# Patient Record
Sex: Male | Born: 1960
Health system: Southern US, Community
[De-identification: ages and names within clinical notes are randomized; demographics above are authoritative.]

## PROBLEM LIST (undated history)

## (undated) DIAGNOSIS — E7800 Pure hypercholesterolemia, unspecified: Secondary | ICD-10-CM

## (undated) DIAGNOSIS — G4730 Sleep apnea, unspecified: Secondary | ICD-10-CM

## (undated) DIAGNOSIS — I10 Essential (primary) hypertension: Secondary | ICD-10-CM

## (undated) DIAGNOSIS — I440 Atrioventricular block, first degree: Secondary | ICD-10-CM

## (undated) DIAGNOSIS — G629 Polyneuropathy, unspecified: Secondary | ICD-10-CM

## (undated) DIAGNOSIS — R002 Palpitations: Secondary | ICD-10-CM

## (undated) HISTORY — DX: Essential (primary) hypertension: I10

## (undated) HISTORY — DX: Palpitations: R00.2

## (undated) HISTORY — PX: ROTATOR CUFF REPAIR: SHX139

## (undated) HISTORY — DX: Atrioventricular block, first degree: I44.0

## (undated) HISTORY — DX: Sleep apnea, unspecified: G47.30

---

## 1998-12-26 ENCOUNTER — Inpatient Hospital Stay (HOSPITAL_COMMUNITY): Admission: EM | Admit: 1998-12-26 | Discharge: 1998-12-27 | Payer: Self-pay | Admitting: Emergency Medicine

## 1998-12-27 ENCOUNTER — Encounter: Payer: Self-pay | Admitting: Cardiology

## 1999-10-17 ENCOUNTER — Emergency Department (HOSPITAL_COMMUNITY): Admission: EM | Admit: 1999-10-17 | Discharge: 1999-10-17 | Payer: Self-pay | Admitting: Emergency Medicine

## 1999-10-17 ENCOUNTER — Encounter: Payer: Self-pay | Admitting: Emergency Medicine

## 2003-09-25 DIAGNOSIS — G4730 Sleep apnea, unspecified: Secondary | ICD-10-CM

## 2003-09-25 HISTORY — DX: Sleep apnea, unspecified: G47.30

## 2008-07-15 ENCOUNTER — Encounter: Admission: RE | Admit: 2008-07-15 | Discharge: 2008-07-15 | Payer: Self-pay | Admitting: Orthopedic Surgery

## 2010-06-23 ENCOUNTER — Emergency Department (HOSPITAL_COMMUNITY): Admission: EM | Admit: 2010-06-23 | Discharge: 2010-06-23 | Payer: Self-pay | Admitting: Emergency Medicine

## 2015-03-14 ENCOUNTER — Observation Stay (HOSPITAL_COMMUNITY)
Admission: EM | Admit: 2015-03-14 | Discharge: 2015-03-15 | Disposition: A | Discharge status: Home / Self Care | Payer: 59 | Attending: Internal Medicine | Admitting: Internal Medicine

## 2015-03-14 ENCOUNTER — Encounter (HOSPITAL_COMMUNITY): Payer: Self-pay | Admitting: Emergency Medicine

## 2015-03-14 DIAGNOSIS — R61 Generalized hyperhidrosis: Secondary | ICD-10-CM | POA: Diagnosis not present

## 2015-03-14 DIAGNOSIS — R112 Nausea with vomiting, unspecified: Secondary | ICD-10-CM | POA: Insufficient documentation

## 2015-03-14 DIAGNOSIS — R0602 Shortness of breath: Secondary | ICD-10-CM | POA: Diagnosis not present

## 2015-03-14 DIAGNOSIS — G629 Polyneuropathy, unspecified: Secondary | ICD-10-CM | POA: Insufficient documentation

## 2015-03-14 DIAGNOSIS — E780 Pure hypercholesterolemia: Secondary | ICD-10-CM | POA: Insufficient documentation

## 2015-03-14 DIAGNOSIS — R42 Dizziness and giddiness: Secondary | ICD-10-CM | POA: Diagnosis not present

## 2015-03-14 DIAGNOSIS — E785 Hyperlipidemia, unspecified: Secondary | ICD-10-CM | POA: Diagnosis not present

## 2015-03-14 DIAGNOSIS — I10 Essential (primary) hypertension: Secondary | ICD-10-CM | POA: Diagnosis not present

## 2015-03-14 DIAGNOSIS — R079 Chest pain, unspecified: Principal | ICD-10-CM | POA: Diagnosis present

## 2015-03-14 DIAGNOSIS — Z885 Allergy status to narcotic agent status: Secondary | ICD-10-CM | POA: Diagnosis not present

## 2015-03-14 DIAGNOSIS — Z79899 Other long term (current) drug therapy: Secondary | ICD-10-CM | POA: Diagnosis not present

## 2015-03-14 HISTORY — DX: Pure hypercholesterolemia, unspecified: E78.00

## 2015-03-14 HISTORY — DX: Polyneuropathy, unspecified: G62.9

## 2015-03-14 NOTE — ED Provider Notes (Addendum)
CSN: [id number]     Arrival date & time 03/14/15  2341 History   First MD Initiated Contact with Patient 03/14/15 2347     This chart was scribed for Loren Racer, MD by Arlan Organ, ED Scribe. This patient was seen in room A05C/A05C and the patient's care was started 12:01 AM.   Chief Complaint  Patient presents with  . Chest Pain  . Nausea  . Emesis    x1   HPI  HPI Comments: Bryse Blanchette brought in by EMS is a 54 y.o. male presents to the Emergency Department complaining of sudden onset L-sided chest pain that radiates to the neck and back onset 11:30 PM this evening while driving in car. Pain rated 8/10 but has improved since time of onset. He also reports sudden onset HA, shortness of breath, nausea, diaphoresis, 1 episode of vomiting, and lightheadedness. 324 mg ASA and 1 nitro tablet given en route to department. Currently he is chest pain and HA free. No recent fever or chills. No previous history of same. No family history of heart issues. Pt takes Bystolic, Atorvastatin, and Neurontin daily. Took medications at 8 pm this evening. No missed or additional doses taken prior to incident. Pt with known allergy to Codeine.  Past Medical History  Diagnosis Date  . Peripheral neuropathy   . High cholesterol    Past Surgical History  Procedure Laterality Date  . Orthopedic surgery     History reviewed. No pertinent family history. History  Substance Use Topics  . Smoking status: Never Smoker   . Smokeless tobacco: Not on file  . Alcohol Use: No    Review of Systems  Constitutional: Positive for diaphoresis. Negative for fever and chills.  Eyes: Negative for photophobia and visual disturbance.  Respiratory: Positive for shortness of breath. Negative for cough and wheezing.   Cardiovascular: Positive for chest pain. Negative for palpitations and leg swelling.  Gastrointestinal: Positive for nausea and vomiting. Negative for abdominal pain.  Musculoskeletal: Negative for  myalgias, back pain, arthralgias, neck pain and neck stiffness.  Skin: Negative for wound.  Neurological: Positive for dizziness, light-headedness and headaches. Negative for syncope, weakness and numbness.  Psychiatric/Behavioral: Negative for confusion.  All other systems reviewed and are negative.     Allergies  Codeine and Hydrocodone  Home Medications   Prior to Admission medications   Medication Sig Start Date End Date Taking? Authorizing Provider  atorvastatin (LIPITOR) 40 MG tablet Take 40 mg by mouth daily.   Yes Historical Provider, MD  desvenlafaxine (PRISTIQ) 100 MG 24 hr tablet Take 100 mg by mouth daily.   Yes Historical Provider, MD  gabapentin (NEURONTIN) 400 MG capsule Take 400 mg by mouth daily.    Yes Historical Provider, MD  nebivolol (BYSTOLIC) 10 MG tablet Take 10 mg by mouth daily.   Yes Historical Provider, MD   Triage Vitals: BP 104/58 mmHg  Pulse 49  Temp(Src) 97.8 F (36.6 C) (Oral)  Resp 16  Ht  (1.854 m)  Wt 212 lb (96.163 kg)  BMI 27.98 kg/m2  SpO2 99%   Physical Exam  Constitutional: He is oriented to person, place, and time. He appears well-developed and well-nourished. No distress.  HENT:  Head: Normocephalic and atraumatic.  Mouth/Throat: Oropharynx is clear and moist.  Eyes: EOM are normal. Pupils are equal, round, and reactive to light.  Neck: Normal range of motion. Neck supple.  Cardiovascular: Normal rate and regular rhythm.   Pulmonary/Chest: Effort normal and breath sounds normal.  No respiratory distress. He has no wheezes. He has no rales.  Abdominal: Soft. Bowel sounds are normal. He exhibits no distension and no mass. There is no tenderness. There is no rebound and no guarding.  Musculoskeletal: Normal range of motion. He exhibits no edema or tenderness.  No calf swelling or tenderness. Distal pulses intact.  Neurological: He is alert and oriented to person, place, and time.  Patient is alert and oriented x3 with clear, goal  oriented speech. Patient has 5/5 motor in all extremities. Sensation is intact to light touch. Bilateral finger-to-nose is normal with no signs of dysmetria.   Skin: Skin is warm and dry. No rash noted. No erythema.  Psychiatric: He has a normal mood and affect. His behavior is normal.  Nursing note and vitals reviewed.   ED Course  Procedures (including critical care time)  DIAGNOSTIC STUDIES: Oxygen Saturation is 99% on RA, Normal by my interpretation.    COORDINATION OF CARE: 12:06 AM- Will order CBC, BMP, i-stat troponin, and CXR. Discussed treatment plan with pt at bedside and pt agreed to plan.     Labs Review Labs Reviewed  BASIC METABOLIC PANEL - Abnormal; Notable for the following:    Glucose, Bld 115 (*)    Calcium 8.8 (*)    All other components within normal limits  CBC  TROPONIN I  TROPONIN I  TROPONIN I  I-STAT TROPOININ, ED    Imaging Review Dg Chest 2 View  03/15/2015   CLINICAL DATA:  Acute onset of left-sided chest pain, radiating to the neck and back. Nausea and vomiting. Lightheadedness. Initial encounter.  EXAM: CHEST  2 VIEW  COMPARISON:  None.  FINDINGS: The lungs are well-aerated and clear. There is no evidence of focal opacification, pleural effusion or pneumothorax.  The heart is normal in size; the mediastinal contour is within normal limits. No acute osseous abnormalities are seen.  IMPRESSION: No acute cardiopulmonary process seen.   Electronically Signed   By: Roanna Raider M.D.   On: 03/15/2015 00:57   Ct Head Wo Contrast  03/15/2015   CLINICAL DATA:  Patient with left-sided chest pain. Lightheadedness. Vomiting.  EXAM: CT HEAD WITHOUT CONTRAST  TECHNIQUE: Contiguous axial images were obtained from the base of the skull through the vertex without intravenous contrast.  COMPARISON:  None.  FINDINGS: Ventricles and sulci appropriate for patient's age. No evidence for acute cortically based infarct, intracranial hemorrhage, mass lesion or mass-effect.  Orbits are unremarkable. Paranasal sinuses are well aerated. Mastoid air cells are well aerated. Calvarium is intact.  IMPRESSION: No acute intracranial process.   Electronically Signed   By: Annia Belt M.D.   On: 03/15/2015 00:56     EKG Interpretation   Date/Time:  Monday March 14 2015 23:48:24 EDT Ventricular Rate:  54 PR Interval:  219 QRS Duration: 105 QT Interval:  430 QTC Calculation: 407 R Axis:   84 Text Interpretation:  Sinus rhythm Prolonged PR interval RSR' in V1 or V2,  right VCD or RVH ST elev, probable normal early repol pattern Confirmed by  Ranae Palms  MD, Lenn Volker ([id number]) on 03/15/2015 12:19:50 AM      MDM   Final diagnoses:  Chest pain, unspecified chest pain type    I personally performed the services described in this documentation, which was scribed in my presence. The recorded information has been reviewed and is accurate.  Patient remains symptom-free. CT head without any evidence of intracranial bleed. Discussed with [facility] hospitalist Dr. Cena Benton and will see  patient in the emergency department.  Loren Racer, MD 03/15/15 Cleophas Dunker  Loren Racer, MD 03/15/15 [phone]

## 2015-03-14 NOTE — ED Notes (Signed)
Pt brought to ED by [facility] from home for left side CP 8/10 going to his neck and back, pt was feeling nausea, vomiting x1 and lightheaded. 324 mg ASA, 1 nitro sl given on his way to ED BP drop to 75/58 a bolus IV fluids started BP back to 100/60. Pt denies any CP at this time.

## 2015-03-15 ENCOUNTER — Emergency Department (HOSPITAL_COMMUNITY): Payer: 59

## 2015-03-15 ENCOUNTER — Encounter (HOSPITAL_COMMUNITY): Payer: Self-pay | Admitting: Radiology

## 2015-03-15 ENCOUNTER — Observation Stay (HOSPITAL_BASED_OUTPATIENT_CLINIC_OR_DEPARTMENT_OTHER): Payer: 59

## 2015-03-15 DIAGNOSIS — R079 Chest pain, unspecified: Principal | ICD-10-CM

## 2015-03-15 DIAGNOSIS — R112 Nausea with vomiting, unspecified: Secondary | ICD-10-CM | POA: Diagnosis not present

## 2015-03-15 DIAGNOSIS — R61 Generalized hyperhidrosis: Secondary | ICD-10-CM | POA: Diagnosis not present

## 2015-03-15 DIAGNOSIS — R0602 Shortness of breath: Secondary | ICD-10-CM | POA: Diagnosis not present

## 2015-03-15 LAB — BASIC METABOLIC PANEL
ANION GAP: 6 (ref 5–15)
ANION GAP: 8 (ref 5–15)
BUN: 12 mg/dL (ref 6–20)
BUN: 13 mg/dL (ref 6–20)
CHLORIDE: 105 mmol/L (ref 101–111)
CHLORIDE: 106 mmol/L (ref 101–111)
CO2: 27 mmol/L (ref 22–32)
CO2: 27 mmol/L (ref 22–32)
Calcium: 8.5 mg/dL — ABNORMAL LOW (ref 8.9–10.3)
Calcium: 8.8 mg/dL — ABNORMAL LOW (ref 8.9–10.3)
Creatinine, Ser: 1.03 mg/dL (ref 0.61–1.24)
Creatinine, Ser: 1.11 mg/dL (ref 0.61–1.24)
GFR calc non Af Amer: >60 mL/min (ref >60)
Glucose, Bld: 115 mg/dL — ABNORMAL HIGH (ref 65–99)
Glucose, Bld: 141 mg/dL — ABNORMAL HIGH (ref 65–99)
Potassium: 3.7 mmol/L (ref 3.5–5.1)
Potassium: 4 mmol/L (ref 3.5–5.1)
SODIUM: 141 mmol/L (ref 135–145)
Sodium: 138 mmol/L (ref 135–145)

## 2015-03-15 LAB — CBC
HEMATOCRIT: 43.5 % (ref 39.0–52.0)
HEMOGLOBIN: 14.7 g/dL (ref 13.0–17.0)
MCH: 29.9 pg (ref 26.0–34.0)
MCHC: 33.8 g/dL (ref 30.0–36.0)
MCV: 88.6 fL (ref 78.0–100.0)
Platelets: 202 10*3/uL (ref 150–400)
RBC: 4.91 MIL/uL (ref 4.22–5.81)
RDW: 13.1 % (ref 11.5–15.5)
WBC: 7.1 10*3/uL (ref 4.0–10.5)

## 2015-03-15 LAB — LIPID PANEL
CHOL/HDL RATIO: 3.7 ratio
CHOLESTEROL: 133 mg/dL (ref 0–200)
HDL: 36 mg/dL — AB (ref >40)
LDL Cholesterol: 76 mg/dL (ref 0–99)
Triglycerides: 105 mg/dL (ref <150)
VLDL: 21 mg/dL (ref 0–40)

## 2015-03-15 LAB — TROPONIN I

## 2015-03-15 LAB — I-STAT TROPONIN, ED: Troponin i, poc: 0 ng/mL (ref 0.00–0.08)

## 2015-03-15 MED ORDER — HEPARIN SODIUM (PORCINE) 5000 UNIT/ML IJ SOLN
5000.0000 [IU] | Freq: Three times a day (TID) | INTRAMUSCULAR | Status: DC
  Administered 2015-03-15: 5000 [IU] via SUBCUTANEOUS
  Filled 2015-03-15 (×3): qty 1

## 2015-03-15 MED ORDER — ONDANSETRON HCL 4 MG/2ML IJ SOLN: 4.0000 mg | Freq: Four times a day (QID) | INTRAMUSCULAR | Status: DC | PRN

## 2015-03-15 MED ORDER — ASPIRIN EC 325 MG PO TBEC
325.0000 mg | DELAYED_RELEASE_TABLET | Freq: Every day | ORAL | Status: DC
  Administered 2015-03-15: 325 mg via ORAL
  Filled 2015-03-15: qty 1

## 2015-03-15 MED ORDER — NEBIVOLOL HCL 5 MG PO TABS: 5.0000 mg | ORAL_TABLET | Freq: Every day | ORAL | Status: DC

## 2015-03-15 MED ORDER — MORPHINE SULFATE 2 MG/ML IJ SOLN: 2.0000 mg | INTRAMUSCULAR | Status: DC | PRN

## 2015-03-15 MED ORDER — GABAPENTIN 400 MG PO CAPS
400.0000 mg | ORAL_CAPSULE | Freq: Every day | ORAL | Status: DC
  Administered 2015-03-15: 400 mg via ORAL
  Filled 2015-03-15: qty 1

## 2015-03-15 MED ORDER — ATORVASTATIN CALCIUM 40 MG PO TABS
40.0000 mg | ORAL_TABLET | Freq: Every day | ORAL | Status: DC
  Filled 2015-03-15: qty 1

## 2015-03-15 MED ORDER — ASPIRIN EC 81 MG PO TBEC: 81.0000 mg | DELAYED_RELEASE_TABLET | Freq: Every day | ORAL | Status: AC

## 2015-03-15 MED ORDER — ACETAMINOPHEN 325 MG PO TABS
650.0000 mg | ORAL_TABLET | ORAL | Status: DC | PRN
  Administered 2015-03-15: 650 mg via ORAL
  Filled 2015-03-15 (×2): qty 2

## 2015-03-15 MED ORDER — GI COCKTAIL ~~LOC~~
30.0000 mL | Freq: Four times a day (QID) | ORAL | Status: DC | PRN
Start: 2015-03-15 — End: 2015-03-15
  Filled 2015-03-15: qty 30

## 2015-03-15 NOTE — Progress Notes (Signed)
  Echocardiogram 2D Echocardiogram has been performed.  Russell Hahn 03/15/2015, 9:34 AM

## 2015-03-15 NOTE — Discharge Summary (Signed)
Physician Discharge Summary   Patient ID: Russell Hahn MRN: [id number] DOB/AGE: 1960-11-28 54 y.o.  Admit date: 03/14/2015 Discharge date: 03/15/2015  Primary Care Physician:  Lenora Boys, MD  Discharge Diagnoses:   . Transient Atypical Chest pain Hypertension Hyperlipidemia  Consults:  None   Recommendations for Outpatient Follow-up:  Patient had no further chest pain episodes, troponins remain negative, 2-D echo unremarkable, no acute EKG changes. He was recommended outpatient stress test if he has any further repeat episodes of chest pain.  TESTS THAT NEED FOLLOW-UP None   DIET: Heart healthy diet    Allergies:   Allergies  Allergen Reactions  . Codeine Itching  . Hydrocodone Itching     Discharge Medications:   Medication List    TAKE these medications        aspirin EC 81 MG tablet  Take 1 tablet (81 mg total) by mouth daily.     atorvastatin 40 MG tablet  Commonly known as:  LIPITOR  Take 40 mg by mouth daily.     desvenlafaxine 100 MG 24 hr tablet  Commonly known as:  PRISTIQ  Take 100 mg by mouth daily.     gabapentin 400 MG capsule  Commonly known as:  NEURONTIN  Take 400 mg by mouth daily.     nebivolol 10 MG tablet  Commonly known as:  BYSTOLIC  Take 10 mg by mouth daily.         Brief H and P: For complete details please refer to admission H and P, but in brief Russell Hahn is a 54 y.o. male  With history of hypercholesterolemia on statin who presented with left-sided chest discomfort radiating to his neck and back. The discomfort happened today while he was driving around 15:05 PM. He was not exerting himself. The problem was also associated with headache and nausea and vomiting. Emesis occurred 1. Patient was given aspirin and nitroglycerin on route to hospital and subsequently headache and discomfort resolved. No family history of coronary artery disease.  Hospital Course:    Atypical Chest pain: Resolved, no hypoxia or  tachycardia. No further chest pain episodes, troponins 3 negative. Patient was admitted and ruled out for acute ACS. Her lipid panel showed LDL 76, cholesterol 133. Patient underwent 2-D echocardiogram which showed EF of 60-65%, grade 1 diastolic dysfunction however no regional wall motion abnormalities. Patient feels back to baseline, he was recommended to continue aspirin 81 mg daily, statin and beta blocker. If he has any further episodes of chest pain, patient may need referral for outpatient stress test.  Hyperlipidemia Continue statin  Hypertension Currently stable, continue bystolic   Day of Discharge BP 111/65 mmHg  Pulse 65  Temp(Src) 97.8 F (36.6 C) (Oral)  Resp 18  Ht 6\' 1"  (1.854 m)  Wt 97.569 kg (215 lb 1.6 oz)  BMI 28.39 kg/m2  SpO2 95%  Physical Exam: General: Alert and awake oriented x3 not in any acute distress. HEENT: anicteric sclera, pupils reactive to light and accommodation CVS: S1-S2 clear no murmur rubs or gallops Chest: clear to auscultation bilaterally, no wheezing rales or rhonchi Abdomen: soft nontender, nondistended, normal bowel sounds Extremities: no cyanosis, clubbing or edema noted bilaterally Neuro: Cranial nerves II-XII intact, no focal neurological deficits   The results of significant diagnostics from this hospitalization (including imaging, microbiology, ancillary and laboratory) are listed below for reference.    LAB RESULTS: Basic Metabolic Panel:  Recent Labs Lab 03/15/15 0003 03/15/15 0825  NA 141 138  K 3.7  4.0  CL 106 105  CO2 27 27  GLUCOSE 115* 141*  BUN 13 12  CREATININE 1.11 1.03  CALCIUM 8.8* 8.5*   Liver Function Tests: No results for input(s): AST, ALT, ALKPHOS, BILITOT, PROT, ALBUMIN in the last 168 hours. No results for input(s): LIPASE, AMYLASE in the last 168 hours. No results for input(s): AMMONIA in the last 168 hours. CBC:  Recent Labs Lab 03/15/15 0003  WBC 7.1  HGB 14.7  HCT 43.5  MCV 88.6   PLT 202   Cardiac Enzymes:  Recent Labs Lab 03/15/15 0229 03/15/15 0825  TROPONINI <0.03 <0.03   BNP: Invalid input(s): POCBNP CBG: No results for input(s): GLUCAP in the last 168 hours.  Significant Diagnostic Studies:  Dg Chest 2 View  03/15/2015   CLINICAL DATA:  Acute onset of left-sided chest pain, radiating to the neck and back. Nausea and vomiting. Lightheadedness. Initial encounter.  EXAM: CHEST  2 VIEW  COMPARISON:  None.  FINDINGS: The lungs are well-aerated and clear. There is no evidence of focal opacification, pleural effusion or pneumothorax.  The heart is normal in size; the mediastinal contour is within normal limits. No acute osseous abnormalities are seen.  IMPRESSION: No acute cardiopulmonary process seen.   Electronically Signed   By: Roanna Raider M.D.   On: 03/15/2015 00:57   Ct Head Wo Contrast  03/15/2015   CLINICAL DATA:  Patient with left-sided chest pain. Lightheadedness. Vomiting.  EXAM: CT HEAD WITHOUT CONTRAST  TECHNIQUE: Contiguous axial images were obtained from the base of the skull through the vertex without intravenous contrast.  COMPARISON:  None.  FINDINGS: Ventricles and sulci appropriate for patient's age. No evidence for acute cortically based infarct, intracranial hemorrhage, mass lesion or mass-effect. Orbits are unremarkable. Paranasal sinuses are well aerated. Mastoid air cells are well aerated. Calvarium is intact.  IMPRESSION: No acute intracranial process.   Electronically Signed   By: Annia Belt M.D.   On: 03/15/2015 00:56    2D ECHO: Study Conclusions  - Left ventricle: The cavity size was normal. There was mild concentric hypertrophy. Systolic function was normal. The estimated ejection fraction was in the range of 60% to 65%. Wall motion was normal; there were no regional wall motion abnormalities. Doppler parameters are consistent with abnormal left ventricular relaxation (grade 1 diastolic dysfunction).   Disposition  and Follow-up:     Discharge Instructions    Diet - low sodium heart healthy    Complete by:  As directed      Discharge instructions    Complete by:  As directed   If you have any further repeat episodes of chest pain, please ask your primary care physician, Dr. Foy Guadalajara for referral for outpatient stress test.     Increase activity slowly    Complete by:  As directed             DISPOSITION: home    DISCHARGE FOLLOW-UP Follow-up Information    Follow up with FRIED, ROBERT L, MD. Schedule an appointment as soon as possible for a visit in 2 weeks.   Specialty:  Family Medicine   Why:  for hospital follow-up   Contact information:   [address] [place] [address] [phone]        Time spent on Discharge: 25 mins   Signed:   Arion Shankles M.D. [facility] 03/15/2015, 12:27 PM Pager: [phone]

## 2015-03-15 NOTE — Progress Notes (Signed)
Russell Hahn transported to 3e20 by stretcher from the emergency room.  Patient complains of no pain, no nausea. Patient walked to bed, and also stood on the scale. Educated to use the urinal. Placed on box 3e37. Currently sinus brady on the monitor in 50s.  Lives at home with wife.  Will continue to observe.

## 2015-03-15 NOTE — ED Notes (Signed)
Hospitalist at the bedside 

## 2015-03-15 NOTE — H&P (Signed)
[facility] History and Physical  Russell Hahn ZOX:[id number] DOB: 01-26-1961 DOA: 03/14/2015  Referring physician: Dr. Ranae Palms PCP: Russell Boys, MD   Chief Complaint: left sided chest discomfort  HPI: Russell Hahn is a 54 y.o. male  With history of hypercholesterolemia on statin who presented with left-sided chest discomfort radiating to his neck and back. The discomfort happened today while he was driving around 81:19 PM. He was not exerting himself. The problem was also associated with headache and nausea and vomiting. Emesis occurred 1. Patient was given aspirin and nitroglycerin on route to hospital and subsequently headache and discomfort resolved.  We were consulted for further medical evaluation recommendations   Review of Systems:  Constitutional:  No weight loss, night sweats, Fevers, chills, fatigue.  HEENT:  + headaches (resolved), Difficulty swallowing,Tooth/dental problems,Sore throat,  No sneezing, itching, ear ache, nasal congestion, post nasal drip,  Cardio-vascular:  + chest pain (resolved), Orthopnea, PND, swelling in lower extremities, anasarca, dizziness, palpitations  GI:  No heartburn, indigestion, abdominal pain, nausea, + vomiting (resolved), diarrhea, change in bowel habits, loss of appetite  Resp:  No shortness of breath with exertion or at rest. No excess mucus, no productive cough, No non-productive cough, No coughing up of blood.No change in color of mucus.No wheezing.No chest wall deformity  Skin:  no rash or lesions.  GU:  no dysuria, change in color of urine, no urgency or frequency. No flank pain.  Musculoskeletal:  No joint pain or swelling. No decreased range of motion. No back pain.  Psych:  No change in mood or affect. No depression or anxiety. No memory loss.   Past Medical History  Diagnosis Date  . Peripheral neuropathy   . High cholesterol    Past Surgical History  Procedure Laterality Date  . Orthopedic surgery      Social History:  reports that he has never smoked. He does not have any smokeless tobacco history on file. He reports that he does not drink alcohol or use illicit drugs.  Allergies  Allergen Reactions  . Codeine Itching  . Hydrocodone Itching    Family history - Denies any family history of any cardiac disease before the age of 33  Prior to Admission medications   Medication Sig Start Date End Date Taking? Authorizing Provider  atorvastatin (LIPITOR) 40 MG tablet Take 40 mg by mouth daily.   Yes Historical Provider, MD  desvenlafaxine (PRISTIQ) 100 MG 24 hr tablet Take 100 mg by mouth daily.   Yes Historical Provider, MD  gabapentin (NEURONTIN) 400 MG capsule Take 400 mg by mouth daily.    Yes Historical Provider, MD  nebivolol (BYSTOLIC) 10 MG tablet Take 10 mg by mouth daily.   Yes Historical Provider, MD   Physical Exam: Filed Vitals:   03/14/15 2351 03/15/15 0000 03/15/15 0109 03/15/15 0115  BP: 102/64 108/64 109/61 104/58  Pulse: 57 52 68 49  Temp: 97.8 F (36.6 C)     TempSrc: Oral     Resp: Height:  (1.854 m)     Weight: 96.163 kg (212 lb)     SpO2: 99% 100%  99%    Wt Readings from Last 3 Encounters:  03/14/15 96.163 kg (212 lb)    General:  Appears calm and comfortable Eyes: PERRL, normal lids, irises & conjunctiva ENT: grossly normal hearing, lips & tongue Neck: no LAD, masses or thyromegaly Cardiovascular: RRR, no m/r/g. No LE edema. Respiratory: CTA bilaterally, no w/r/r. Normal respiratory effort.  Abdomen: soft, nt, nd Skin: no rash or induration seen on limited exam Musculoskeletal: grossly normal tone BUE/BLE, no chest pain on palpation Psychiatric: grossly normal mood and affect, speech fluent and appropriate Neurologic: grossly non-focal.          Labs on Admission:  Basic Metabolic Panel:  Recent Labs Lab 03/15/15 0003  NA 141  K 3.7  CL 106  CO2 27  GLUCOSE 115*  BUN 13  CREATININE 1.11  CALCIUM 8.8*   Liver  Function Tests: No results for input(s): AST, ALT, ALKPHOS, BILITOT, PROT, ALBUMIN in the last 168 hours. No results for input(s): LIPASE, AMYLASE in the last 168 hours. No results for input(s): AMMONIA in the last 168 hours. CBC:  Recent Labs Lab 03/15/15 0003  WBC 7.1  HGB 14.7  HCT 43.5  MCV 88.6  PLT 202   Cardiac Enzymes: No results for input(s): CKTOTAL, CKMB, CKMBINDEX, TROPONINI in the last 168 hours.  BNP (last 3 results) No results for input(s): BNP in the last 8760 hours.  ProBNP (last 3 results) No results for input(s): PROBNP in the last 8760 hours.  CBG: No results for input(s): GLUCAP in the last 168 hours.  Radiological Exams on Admission: Dg Chest 2 View  03/15/2015   CLINICAL DATA:  Acute onset of left-sided chest pain, radiating to the neck and back. Nausea and vomiting. Lightheadedness. Initial encounter.  EXAM: CHEST  2 VIEW  COMPARISON:  None.  FINDINGS: The lungs are well-aerated and clear. There is no evidence of focal opacification, pleural effusion or pneumothorax.  The heart is normal in size; the mediastinal contour is within normal limits. No acute osseous abnormalities are seen.  IMPRESSION: No acute cardiopulmonary process seen.   Electronically Signed   By: Roanna Raider M.D.   On: 03/15/2015 00:57   Ct Head Wo Contrast  03/15/2015   CLINICAL DATA:  Patient with left-sided chest pain. Lightheadedness. Vomiting.  EXAM: CT HEAD WITHOUT CONTRAST  TECHNIQUE: Contiguous axial images were obtained from the base of the skull through the vertex without intravenous contrast.  COMPARISON:  None.  FINDINGS: Ventricles and sulci appropriate for patient's age. No evidence for acute cortically based infarct, intracranial hemorrhage, mass lesion or mass-effect. Orbits are unremarkable. Paranasal sinuses are well aerated. Mastoid air cells are well aerated. Calvarium is intact.  IMPRESSION: No acute intracranial process.   Electronically Signed   By: Annia Belt M.D.    On: 03/15/2015 00:56    EKG: Independently reviewed. Normal sinus rhythm with no ST elevations or depressions  Assessment/Plan Active Problems:   Chest pain: Atypical - Telemetry monitoring - obtain troponin q 6 hr x 3 - EKG with no ST elevations - If workup negative would consider outpatient stress testing but will defer this decision to rounding physician  Code Status: Full code DVT Prophylaxis: Heparin Family Communication: Discussed with patient and wife at bedside Disposition Plan: Telemetry observation  Time spent: > 40 minutes  Penny Pia [facility] Pager [phone]

## 2015-03-15 NOTE — Progress Notes (Signed)
Orders received for pt discharge.  Discharge summary printed and reviewed with pt.  Explained medication regimen, and pt had no further questions at this time.  IV removed and site remains clean, dry, intact.  Telemetry removed.  Pt in stable condition and awaiting transport. 

## 2015-03-23 ENCOUNTER — Telehealth: Payer: Self-pay | Admitting: Internal Medicine

## 2015-03-23 NOTE — Telephone Encounter (Signed)
Received records from [facility] for appointment on 05/06/15 with Dr Rennis GoldenHilty. [facility] for Dr Blanchie DessertHilty's schedule on 05/06/15.

## 2015-05-06 ENCOUNTER — Ambulatory Visit (INDEPENDENT_AMBULATORY_CARE_PROVIDER_SITE_OTHER): Payer: 59 | Admitting: Internal Medicine

## 2015-05-06 ENCOUNTER — Encounter: Payer: Self-pay | Admitting: Internal Medicine

## 2015-05-06 VITALS — BP 110/70 | HR 59 | Ht 73.0 in | Wt 213.5 lb

## 2015-05-06 DIAGNOSIS — I10 Essential (primary) hypertension: Secondary | ICD-10-CM | POA: Insufficient documentation

## 2015-05-06 DIAGNOSIS — R079 Chest pain, unspecified: Secondary | ICD-10-CM | POA: Diagnosis not present

## 2015-05-06 DIAGNOSIS — E785 Hyperlipidemia, unspecified: Secondary | ICD-10-CM

## 2015-05-06 DIAGNOSIS — G629 Polyneuropathy, unspecified: Secondary | ICD-10-CM | POA: Diagnosis not present

## 2015-05-06 DIAGNOSIS — Z8669 Personal history of other diseases of the nervous system and sense organs: Secondary | ICD-10-CM

## 2015-05-06 NOTE — Progress Notes (Signed)
OFFICE NOTE  Chief Complaint:  Follow-up hospitalization for chest pain  Primary Care Physician: Russell Boys, MD  HPI:  Russell Hahn is a pleasant 54 year old male who follows up today for evaluation of chest pain. He reports being in his usual state of health until prior to his admission to the hospital. He was driving and then suddenly became acutely nauseated, diaphoretic, pale and started to have chest discomfort that radiated to his back. This is approximate 2 hours after eating at [employer]. Family then called 911 and he was taken to the emergency department. In route he was given nitroglycerin which cause of sharp drop in blood pressure to 60/40. This worsened his symptoms but subsequently his symptoms resolved almost completely in the emergency department. Initial troponin was negative and he was admitted for rule out overnight in which subsequent troponins were negative. An echo was performed which showed normal systolic function and mild diastolic dysfunction with no significant valvular disease or wall motion abnormalities. EKG showed no ischemia but there was a first-degree AV block. I repeated his EKG today which shows sinus bradycardia 59 and a first-degree AV block. He's had no further chest pain symptoms. He does not get short of breath with exertion. 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  PMHx:  Past Medical History  Diagnosis Date  . Peripheral neuropathy   . High cholesterol     Past Surgical History  Procedure Laterality Date  . Orthopedic surgery      FAMHx:  No family history on file.  SOCHx:   reports that he has never smoked. He does not have any smokeless tobacco history on file. He reports that he does not drink alcohol or use illicit drugs.  ALLERGIES:  Allergies  Allergen Reactions  . Codeine Itching  . Hydrocodone Itching    ROS: A comprehensive review of systems was  negative except for: Cardiovascular: positive for chest pain  HOME MEDS: Current Outpatient Prescriptions  Medication Sig Dispense Refill  . aspirin EC 81 MG tablet Take 1 tablet (81 mg total) by mouth daily. 30 tablet 3  . atorvastatin (LIPITOR) 40 MG tablet Take 40 mg by mouth daily.    Marland Kitchen desvenlafaxine (PRISTIQ) 100 MG 24 hr tablet Take 100 mg by mouth daily.    Marland Kitchen gabapentin (NEURONTIN) 800 MG tablet Take 0.5 tablets by mouth daily.    . nebivolol (BYSTOLIC) 10 MG tablet Take 10 mg by mouth daily.     No current facility-administered medications for this visit.    LABS/IMAGING: No results found for this or any previous visit (from the past 48 hour(s)). No results found.  WEIGHTS: Wt Readings from Last 3 Encounters:  05/06/15 213 lb 8 oz (96.843 kg)  03/15/15 215 lb 1.6 oz (97.569 kg)    VITALS: BP 110/70 mmHg  Pulse 59  Ht 6\' 1"  (1.854 m)  Wt 213 lb 8 oz (96.843 kg)  BMI 28.17 kg/m2  EXAM: General appearance: alert and no distress Neck: no carotid bruit and no JVD Lungs: clear to auscultation bilaterally Heart: regular rate and rhythm, S1, S2 normal, no murmur, click, rub or gallop Abdomen: soft, non-tender; bowel sounds normal; no masses,  no organomegaly Extremities: extremities normal, atraumatic, no cyanosis or edema Pulses: 2+ and symmetric Skin: Skin color, texture, turgor normal. No rashes or lesions Neurologic: Grossly normal Psych: Pleasant  EKG: Sinus bradycardia with first-degree AV block  at 23  ASSESSMENT: 1. Chest pain, associated nausea and diaphoresis - possible vagal reaction 2. Hypertension 3. Dyslipidemia  PLAN: 1.   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 He had an episode of nausea, diaphoresis and pale skin associated with chest discomfort and pain that radiated across his back. This resolved fairly quickly and  workup in the hospital was negative including an echocardiogram which showed only mild diastolic dysfunction. The episodes certainly sounds like it could've been cardiac however troponins were surprisingly negative. No EKG changes were noted and there are no persistent EKG changes. It's possible this could've been a vagal reaction related to eating or reflux. Nevertheless, its reasonable to consider stress testing based on his symptoms. I prefer an exercise Myoview. If this is negative, he wishes to start an exercise routine.  Thanks for the kind referral. Plan to see him back to discuss results of the stress test in a few weeks.  Chrystie Nose, MD, [facility] Attending Cardiologist [facility]  Chrystie Nose 05/06/2015, 5:01 PM

## 2015-05-06 NOTE — Patient Instructions (Signed)
Your physician has requested that you have an exercise stress myoview. For further information please visit [email or web address] Please follow instruction sheet, as given.  Your physician recommends that you schedule a follow-up appointment 3-4 weeks (OK to double book)

## 2015-05-09 ENCOUNTER — Encounter: Payer: Self-pay | Admitting: *Deleted

## 2015-05-25 ENCOUNTER — Telehealth (HOSPITAL_COMMUNITY): Payer: Self-pay | Admitting: Radiology

## 2015-05-25 NOTE — Telephone Encounter (Signed)
Encounter complete. 

## 2015-05-27 ENCOUNTER — Ambulatory Visit (HOSPITAL_COMMUNITY)
Admission: RE | Admit: 2015-05-27 | Discharge: 2015-05-27 | Disposition: A | Discharge status: Home / Self Care | Payer: 59 | Source: Ambulatory Visit | Attending: Cardiovascular Disease | Admitting: Cardiovascular Disease

## 2015-05-27 DIAGNOSIS — I10 Essential (primary) hypertension: Secondary | ICD-10-CM | POA: Diagnosis not present

## 2015-05-27 DIAGNOSIS — R079 Chest pain, unspecified: Secondary | ICD-10-CM

## 2015-05-27 DIAGNOSIS — E785 Hyperlipidemia, unspecified: Secondary | ICD-10-CM | POA: Diagnosis not present

## 2015-05-27 LAB — MYOCARDIAL PERFUSION IMAGING
CHL CUP MPHR: 166 {beats}/min
CHL CUP NUCLEAR SDS: 0
CHL CUP NUCLEAR SRS: 0
CHL CUP RESTING HR STRESS: 78 {beats}/min
CSEPED: 11 min
CSEPEDS: 0 s
Estimated workload: 11.3 METS
LV dias vol: 113 mL
LV sys vol: 41 mL
Peak HR: 148 {beats}/min
Percent HR: 89 %
RPE: 15
SSS: 0
TID: 0.93

## 2015-05-27 MED ORDER — TECHNETIUM TC 99M SESTAMIBI GENERIC - CARDIOLITE
10.7000 | Freq: Once | INTRAVENOUS | Status: AC | PRN
  Administered 2015-05-27: 11 via INTRAVENOUS

## 2015-05-27 MED ORDER — TECHNETIUM TC 99M SESTAMIBI GENERIC - CARDIOLITE
30.4000 | Freq: Once | INTRAVENOUS | Status: AC | PRN
  Administered 2015-05-27: 30 via INTRAVENOUS

## 2015-06-06 ENCOUNTER — Ambulatory Visit: Payer: 59 | Admitting: Internal Medicine

## 2016-02-21 ENCOUNTER — Encounter (HOSPITAL_COMMUNITY): Payer: Self-pay | Admitting: Emergency Medicine

## 2016-02-21 ENCOUNTER — Emergency Department (HOSPITAL_COMMUNITY): Payer: 59

## 2016-02-21 ENCOUNTER — Emergency Department (HOSPITAL_COMMUNITY)
Admission: EM | Admit: 2016-02-21 | Discharge: 2016-02-21 | Disposition: A | Discharge status: Home / Self Care | Payer: 59 | Attending: Emergency Medicine | Admitting: Emergency Medicine

## 2016-02-21 DIAGNOSIS — Z7982 Long term (current) use of aspirin: Secondary | ICD-10-CM | POA: Diagnosis not present

## 2016-02-21 DIAGNOSIS — Z79899 Other long term (current) drug therapy: Secondary | ICD-10-CM | POA: Insufficient documentation

## 2016-02-21 DIAGNOSIS — G629 Polyneuropathy, unspecified: Secondary | ICD-10-CM | POA: Diagnosis not present

## 2016-02-21 DIAGNOSIS — I10 Essential (primary) hypertension: Secondary | ICD-10-CM | POA: Insufficient documentation

## 2016-02-21 DIAGNOSIS — R002 Palpitations: Secondary | ICD-10-CM | POA: Insufficient documentation

## 2016-02-21 DIAGNOSIS — F419 Anxiety disorder, unspecified: Secondary | ICD-10-CM | POA: Diagnosis not present

## 2016-02-21 DIAGNOSIS — E7800 Pure hypercholesterolemia, unspecified: Secondary | ICD-10-CM | POA: Insufficient documentation

## 2016-02-21 LAB — CBC
HEMATOCRIT: 47.9 % (ref 39.0–52.0)
Hemoglobin: 16.6 g/dL (ref 13.0–17.0)
MCH: 30.5 pg (ref 26.0–34.0)
MCHC: 34.7 g/dL (ref 30.0–36.0)
MCV: 87.9 fL (ref 78.0–100.0)
Platelets: 242 10*3/uL (ref 150–400)
RBC: 5.45 MIL/uL (ref 4.22–5.81)
RDW: 12.8 % (ref 11.5–15.5)
WBC: 10.6 10*3/uL — AB (ref 4.0–10.5)

## 2016-02-21 LAB — BASIC METABOLIC PANEL
Anion gap: 8 (ref 5–15)
BUN: 12 mg/dL (ref 6–20)
CO2: 24 mmol/L (ref 22–32)
CREATININE: 1.17 mg/dL (ref 0.61–1.24)
Calcium: 9.6 mg/dL (ref 8.9–10.3)
Chloride: 106 mmol/L (ref 101–111)
GFR calc Af Amer: >60 mL/min (ref >60)
GFR calc non Af Amer: >60 mL/min (ref >60)
Glucose, Bld: 107 mg/dL — ABNORMAL HIGH (ref 65–99)
Potassium: 4.1 mmol/L (ref 3.5–5.1)
SODIUM: 138 mmol/L (ref 135–145)

## 2016-02-21 LAB — I-STAT TROPONIN, ED: Troponin i, poc: 0 ng/mL (ref 0.00–0.08)

## 2016-02-21 MED ORDER — LORAZEPAM 1 MG PO TABS: 0.5000 mg | ORAL_TABLET | Freq: Three times a day (TID) | ORAL | Status: AC | PRN

## 2016-02-21 MED ORDER — LORAZEPAM 1 MG PO TABS
1.0000 mg | ORAL_TABLET | Freq: Once | ORAL | Status: AC
  Administered 2016-02-21: 1 mg via ORAL
  Filled 2016-02-21: qty 1

## 2016-02-21 NOTE — Discharge Instructions (Signed)
Substance Abuse Treatment Programs ° °Intensive Outpatient Programs °[facility]     °[address]      °[place], [place]                   °[phone]      ° °[facility] °[address] °[place], [place] °[phone] ° °[facility]     °(Inpatient and outpatient)     °[address]           °[phone]   ° °[facility] °336-288-1484 (Suboxone and Methadone) ° °[address]      °[place], [place] [address]      °336-882-2125      ° °[address] °[place], [place] °852-3033 ° °[facility] (Outpatient/Inpatient, Chemical)    °(insurance only) [phone]      °       °[facility] (Groups & Residential) °[place] °336-389-1413 ° °   °[facility]     °[address]     °[place], [place]      °[phone]      ° °[facility] (for caregivers and family) °[address] °[place], [place] °336-299-4655 ° ° ° ° ° °Residential Treatment Programs °[facility]      °[address], [place], [place] [address]  °([phone]      ° °[facility] °[address] [place], [place] [address] °[phone] ° °[facility]        °336-248-8914      ° °[facility] °1-800-659-3381 ° °[facility]             °[address]                                         °[place], [place]                                                °877-615-2722 or 336-784-9470                              ° °[facility] [place] °[address] °[place], [address] °1.877.941.8954 ° °[facility]    °[address]      °[place], [place]     °[phone]      ° °[facility] [place] [facility] °[address] °[place], [place] °336-285-9073 ° °[facility]   °[address]     °[place], [place] [address]     °[phone]      °Admissions: 8am-3pm M-F ° °[facility] °[address] °[place],  [place] °336-227-7417 ° °[facility]: Residential Program (90 Days)   °[place], [place]      °336-725-8389 or 800-758-6077    ° °[facility] °[place], [place] °(Walk in Hours over the weekend or by referral) ° °[facility] °[address], [place],  [address] °([phone] ° °Crisis Mobile: [facility]:  1-877-626-1772 (for crisis response 24 hours a day) °[facility] Hotline:      1-800-256-2452 °Outpatient Psychiatry and Counseling ° °[facility]: Mobile Crisis   Management 24 hours:  1-877-626-1772 ° °[facility] sliding scale fee and walk in schedule: M-F 8am-12pm/1pm-3pm °[address]  °[place], [place] [address] °[phone] ° °[facility] °[address] °[place], [place] [address] °336-703-9650 ° °[facility] (Formerly known [facility])- new patient walk-in appointments available Monday - Friday 8am -3pm.          °[address] °[place], [place] [address] °336-676-6840 or crisis line- 336-676-6905 ° °[facility]/ Intensive Outpatient Therapy Program °[address] °[place], [place] [address] °336-832-9804 ° °[facility]                  °Crisis Services      °336.641.4993      °[address]     °[place], [place] [address]                ° °[facility]   °[facility] °[phone] °[address] °[place], [place] [address] ° ° °[facility]          °[address],  °[place], [place] [address]       °([phone] ° °[facility] °[address] °[place], [place] [address] °336-292-1510 ° °[facility]    °[address]    °[place], [place] [address]     °336-632-3505      ° °Parish McKinney, MD     °[address]     °[place] [address]     °336-282-1251     °  °[facility] °[address] °[place] [address] ° °[facility]     °[address]     °[place], [place]      °336-542-2076      ° °[facility] °Shamsher Ahluwalia, MD °[address] °[place], [place] [address] °336-420-9558 ° °[facility]     °[address]     °[place], [place] [address]     °336-274-1237      ° °[facility] °[address] °[place], [place] [address] °336-854-4450 °Resources for Temporary Residential Assistance/Crisis Centers ° °DAY CENTERS °[facility] °M-F 8am-3pm   °[address] [place], [place] [address]   336-332-0824 °Services include: laundry, barbering, support groups, case management, phone  & computer access, showers, AA/NA mtgs, mental health/substance abuse nurse, job skills class, disability information, VA assistance, spiritual classes, etc.  ° °HOMELESS SHELTERS ° °[facility]     °[facility]   °[address], [place]     °336.271.5959       °       °[facility] (women and children)       °[address] °[place], [place] [address] °336-275-0820 °[email or web address] for application and process °Application Required ° °[facility]   °[address]    °[place] [address]     °[phone]       °             °[facility] °[address] °,  [address] °336.273.5572 °336-235-0363(schedule application appt.) °Application Required ° °[facility] (women only)    °[address]     °[place],  [address]     °336-884-1039      °  Intake starts 6pm daily °Need valid ID, SSC, & Police report °[facility] [place] °[address] °[place], [place] °336-881-5420 °Application Required ° °[facility] (men only)     °[address]      °[place], [place]     °[phone]      ° °[facility] °(Pregnant women only) °[address] °[place], [place] °336-275-0206 ° °[facility]      °[address]      °[place], [place] [address]     °336-722-9951      °       °[facility] °[address] °[place], [place] °336-723-1848 °90 day commitment/SA/Application process ° °[facility](men only)     °[address]     °[place], [place]     °336-748-1962       °Check-in at 7pm     °       °[facility] °[address] °[place], [place] [address] °336-248-6684 °Men/Women/Women and Children must be there by 7 pm ° °[facility] °[place],  °336-722-8721                ° °

## 2016-02-21 NOTE — ED Provider Notes (Signed)
CSN: [id number]     Arrival date & time 02/21/16  1555 History   First MD Initiated Contact with Patient 02/21/16 2028     Chief Complaint  Patient presents with  . Palpitations     (Consider location/radiation/quality/duration/timing/severity/associated sxs/prior Treatment) Patient is a 55 y.o. male presenting with general illness. The history is provided by the patient.  Illness Location:  Palpitations Severity:  Mild Onset quality:  Unable to specify Duration:  2 months Timing:  Intermittent Progression:  Resolved Chronicity:  Recurrent Context:  2 months of intermittent palpitations. Often will be associated with stressful scenarios. We'll normally lasts for about 10 seconds, he will cough, resolved. No chest pain. Negative Shores of breath, nausea, diaphoresis. No lightheadedness or syncope. Recently was told he will be losing his job. Has had more frequency of these episodes. Associated symptoms: no abdominal pain, no chest pain, no cough, no diarrhea, no fever, no headaches, no nausea, no shortness of breath and no vomiting     Past Medical History  Diagnosis Date  . Peripheral neuropathy (HCC)   . High cholesterol   . Sleep apnea 2005  . Hypertension    Past Surgical History  Procedure Laterality Date  . Rotator cuff repair     Family History  Problem Relation Age of Onset  . Diabetes Mother 7165  . Leukemia Mother 6185  . Alzheimer's disease Father 1278   Social History  Substance Use Topics  . Smoking status: Never Smoker   . Smokeless tobacco: None  . Alcohol Use: No    Review of Systems  Constitutional: Negative for fever, chills and diaphoresis.  Respiratory: Negative for cough and shortness of breath.   Cardiovascular: Positive for palpitations. Negative for chest pain and leg swelling.  Gastrointestinal: Negative for nausea, vomiting, abdominal pain and diarrhea.  Musculoskeletal: Negative for back pain and neck pain.  Neurological: Negative for syncope,  light-headedness and headaches.  All other systems reviewed and are negative.     Allergies  Codeine and Hydrocodone  Home Medications   Prior to Admission medications   Medication Sig Start Date End Date Taking? Authorizing Provider  aspirin EC 81 MG tablet Take 1 tablet (81 mg total) by mouth daily. 03/15/15   Ripudeep Jenna LuoK Rai, MD  atorvastatin (LIPITOR) 40 MG tablet Take 40 mg by mouth daily.    Historical Provider, MD  desvenlafaxine (PRISTIQ) 100 MG 24 hr tablet Take 100 mg by mouth daily.    Historical Provider, MD  gabapentin (NEURONTIN) 800 MG tablet Take 0.5 tablets by mouth daily. 03/18/15   Historical Provider, MD  nebivolol (BYSTOLIC) 10 MG tablet Take 10 mg by mouth daily.    Historical Provider, MD   BP 129/85 mmHg  Pulse 55  Temp(Src) 97.4 F (36.3 C) (Oral)  Resp 18  SpO2 98% Physical Exam  Constitutional: He is oriented to person, place, and time. He appears well-developed and well-nourished. No distress.  HENT:  Head: Normocephalic and atraumatic.  Eyes: EOM are normal. Pupils are equal, round, and reactive to light.  Cardiovascular: Normal rate, regular rhythm, S1 normal, S2 normal and intact distal pulses.   Pulmonary/Chest: Effort normal. No respiratory distress.  Abdominal: Soft. There is no tenderness. There is no rebound and no guarding.  Musculoskeletal: He exhibits no edema.       Right lower leg: He exhibits no swelling and no edema.       Left lower leg: He exhibits no swelling and no edema.  Neurological: He is  alert and oriented to person, place, and time. He has normal strength. No cranial nerve deficit or sensory deficit. Coordination normal. GCS eye subscore is 4. GCS verbal subscore is 5. GCS motor subscore is 6.  Skin: Skin is warm and dry.    ED Course  Procedures (including critical care time) Labs Review Labs Reviewed  BASIC METABOLIC PANEL - Abnormal; Notable for the following:    Glucose, Bld 107 (*)    All other components within  normal limits  CBC - Abnormal; Notable for the following:    WBC 10.6 (*)    All other components within normal limits  Rosezena Sensor, ED    Imaging Review Dg Chest 2 View  02/21/2016  CLINICAL DATA:  Cardiac flutter.  Hypertension EXAM: CHEST  2 VIEW COMPARISON:  March 15, 2015 FINDINGS: There is no edema or consolidation. The heart size and pulmonary vascularity are normal. No adenopathy. No bone lesions. IMPRESSION: No edema or consolidation. Electronically Signed   By: Bretta Bang III M.D.   On: 02/21/2016 16:43   I have personally reviewed and evaluated these images and lab results as part of my medical decision-making.   EKG Interpretation   Date/Time:  Tuesday Feb 21 2016 16:10:28 EDT Ventricular Rate:  101 PR Interval:  176 QRS Duration: 84 QT Interval:  336 QTC Calculation: 435 R Axis:   87 Text Interpretation:  Sinus tachycardia Otherwise normal ECG Confirmed by  Ranae Palms  MD, DAVID ([id number]) on 02/21/2016 8:54:24 PM      MDM   Final diagnoses:  Palpitations  Anxiety    55 year old male presenting with palpitations likely secondary to anxiety. Since been a chronic condition for the last couple months. Has not seen another physician regarding it. Has had increase frequency of these episodes since being told he was given lose his job in the last few days. Denies any chest pain, shortness of breath, nausea, diaphoresis. EKG without ischemic changes or interval abnormalities. Troponin undetectable. Doubt ACS. PERC negative, doubt PE. Denies fevers and chills. Chest x-ray without evidence of pneumonia or pneumothorax. Description of symptoms is not consistent with aortic dissection. No tenderness palpation of his abdomen. Doubt acute abdomen. We'll have the patient follow-up with his primary care doctor for Holter monitor placement.   Patient is also asking for referral to a therapist or psychiatrist for his increasing anxiety recently. He denies any suicidal ideation,  homicidal ideation, audiovisual hallucinations. He reports he feels comfortable going home and will be going home with his wife. Gave him a dose of by mouth Ativan here. We'll give him a short perception for this. We'll give him appropriate references.  Strict return precautions provided. Encouraged him to see his primary care doctor as well as the referred therapist.  Patient discharged in stable condition.  Lindalou Hose, MD 02/21/16 2956  Loren Racer, MD 02/21/16 [phone]

## 2016-02-21 NOTE — ED Notes (Signed)
Pt sts palpitations today with some diaphoresis; pt appears very anxious

## 2016-02-22 ENCOUNTER — Telehealth: Payer: Self-pay | Admitting: Internal Medicine

## 2016-02-22 NOTE — Telephone Encounter (Signed)
Returned call to patient.He stated he has been having palpitations.Stated he went to ER yesterday and was told to call office for a monitor.Advised he needs to be seen first,last appointment with Dr.Hilty 04/2015.Appointment scheduled with Ward Givenshris Berge NP 02/23/16 [facility].

## 2016-02-22 NOTE — Telephone Encounter (Signed)
New message   Pt is calling for an order for an monitor

## 2016-02-23 ENCOUNTER — Ambulatory Visit (INDEPENDENT_AMBULATORY_CARE_PROVIDER_SITE_OTHER): Payer: 59 | Admitting: Physician Assistant

## 2016-02-23 ENCOUNTER — Encounter: Payer: Self-pay | Admitting: Physician Assistant

## 2016-02-23 VITALS — BP 120/82 | HR 70 | Ht 73.0 in | Wt 221.8 lb

## 2016-02-23 DIAGNOSIS — I440 Atrioventricular block, first degree: Secondary | ICD-10-CM | POA: Insufficient documentation

## 2016-02-23 DIAGNOSIS — I10 Essential (primary) hypertension: Secondary | ICD-10-CM | POA: Diagnosis not present

## 2016-02-23 DIAGNOSIS — R002 Palpitations: Secondary | ICD-10-CM

## 2016-02-23 DIAGNOSIS — E785 Hyperlipidemia, unspecified: Secondary | ICD-10-CM

## 2016-02-23 DIAGNOSIS — Z8669 Personal history of other diseases of the nervous system and sense organs: Secondary | ICD-10-CM

## 2016-02-23 LAB — TSH: TSH: 2.47 mIU/L (ref 0.40–4.50)

## 2016-02-23 NOTE — Patient Instructions (Signed)
Medication Instructions:  Your physician recommends that you continue on your current medications as directed. Please refer to the Current Medication list given to you today.   Labwork: Your physician recommends that you have lab work today: tsh   Testing/Procedures: Your physician has recommended that you wear an event monitor. Event monitors are medical devices that record the heart's electrical activity. Doctors most often us these monitors to diagnose arrhythmias. Arrhythmias are problems with the speed or rhythm of the heartbeat. The monitor is a small, portable device. You can wear one while you do your normal daily activities. This is usually used to diagnose what is causing palpitations/syncope (passing out).    Follow-Up: Your physician recommends that you keep your scheduled  follow[facility]   Any Other Special Instructions Will Be Listed Below (If Applicable).     If you need a refill on your cardiac medications before your next appointment, please call your pharmacy.

## 2016-02-23 NOTE — Progress Notes (Signed)
Cardiology Office Note    Date:  02/23/2016   ID:  Russell Hahn, DOB [birth year], MRN [id number]  PCP:  Lenora BoysFRIED, ROBERT L, MD  Cardiologist:  Rennis GoldenHilty  Chief Complaint: palpitaions  History of Present Illness:   Russell Hahn is a 55 y.o. 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    Seen by Dr. Rennis GoldenHilty 05/06/15 for chest pain associated with nausea and diaphoresis and felt possible vagal reaction. Exercise Myoview 05/2015 was normal with EF of 64%. Echo 6/16 showed LV EF of 60-65%, grade 1 DD.   The patient is seen in ED 02/21/16 for 2 months hx of intermittent palpitations and felt secondary to anxiety. Given dose of ativan and referred made to a therapist. His labs work and chest was reassuring (reviewed).   He has been having palpitation for the past 2-3 months. The palpitations are intermittent and lasting for about 10 secs. Resolves with cough. He describes palpitation as "fluttering sensation". He was under lot of stress Sunday 5/28 and did not slept that night. The had more frequent and longer lasting palpitations 5/29 leading to ER presentation. The patient denies nausea, vomiting, fever, chest pain, shortness of breath, orthopnea, PND, dizziness, syncope, cough, congestion, abdominal pain, hematochezia, melena, lower extremity edema. He drinks 2 coke/day. No tobacco abuse or alcohol abuse.   During our conversion he suddenly felt palpitation and resolved with cough (entire episode lasted less than 10 sec). Unable examine patient.     Past Medical History  Diagnosis Date  . Peripheral neuropathy (HCC)   . High cholesterol   . Sleep apnea 2005  . Hypertension   . 1St degree AV block   . Palpitations     Past Surgical History  Procedure Laterality Date  . Rotator cuff repair      Current  Medications: Prior to Admission medications   Medication Sig Start Date End Date Taking? Authorizing Provider  aspirin EC 81 MG tablet Take 1 tablet (81 mg total) by mouth daily. 03/15/15   Ripudeep Jenna LuoK Rai, MD  atorvastatin (LIPITOR) 40 MG tablet Take 40 mg by mouth daily.    Historical Provider, MD  desvenlafaxine (PRISTIQ) 100 MG 24 hr tablet Take 100 mg by mouth daily.    Historical Provider, MD  gabapentin (NEURONTIN) 800 MG tablet Take 0.5 tablets by mouth daily. 03/18/15   Historical Provider, MD  LORazepam (ATIVAN) 1 MG tablet Take 0.5 tablets (0.5 mg total) by mouth every 8 (eight) hours as needed for anxiety or sleep. 02/21/16   Lindalou HoseSean O'Rourke, MD  nebivolol (BYSTOLIC) 10 MG tablet Take 10 mg by mouth daily.    Historical Provider, MD    Allergies:   Codeine and Hydrocodone   Social History   Social History  . Marital Status: Married    Spouse Name: N/A  . Number of Children: 3  . Years of Education: 12   Occupational History  .  [employer]   Social History Main Topics  . Smoking status: Never Smoker   . Smokeless tobacco: None  . Alcohol Use: No  . Drug Use: No  . Sexual Activity: Not Asked   Other Topics Concern  . None   Social History Narrative     Family History:  The patient's family history includes Alzheimer's disease (age of onset: 4978) in his father; Diabetes (age of onset:  50) in his mother; Leukemia (age of onset: 33) in his mother.   ROS:   Please see the history of present illness.    ROS All other systems reviewed and are negative.   PHYSICAL EXAM:   VS:  BP 120/82 mmHg  Pulse 70  Ht  (1.854 m)  Wt 221 lb 12.8 oz (100.608 kg)  BMI 29.27 kg/m2   GEN: Well nourished, well developed, in no acute distress HEENT: normal Neck: no JVD, carotid bruits, or masses Cardiac: RRR; no murmurs, rubs, or gallops,no edema  Respiratory:  clear to auscultation bilaterally, normal work of breathing GI: soft, nontender, nondistended, + BS MS: no deformity or  atrophy Skin: warm and dry, no rash Neuro:  Alert and Oriented x 3, Strength and sensation are intact Psych: euthymic mood, full affect  Wt Readings from Last 3 Encounters:  02/23/16 221 lb 12.8 oz (100.608 kg)  05/27/15 213 lb (96.616 kg)  05/06/15 213 lb 8 oz (96.843 kg)      Studies/Labs Reviewed:   EKG:  EKG is ordered today.  The ekg ordered today demonstrates NSR, HR of 71, PR interval of  Recent Labs: 02/21/2016: BUN 12; Creatinine, Ser 1.17; Hemoglobin 16.6; Platelets 242; Potassium 4.1; Sodium 138  I start troponin: negative    Lipid Panel    Component Value Date/Time   CHOL 133 03/15/2015 0825   TRIG 105 03/15/2015 0825   HDL 36* 03/15/2015 0825   CHOLHDL 3.7 03/15/2015 0825   VLDL 21 03/15/2015 0825   LDLCALC 76 03/15/2015 0825    Additional studies/ records that were reviewed today include:   Echocardiogram: 03/15/15 LV EF: 60% - 65%  ------------------------------------------------------------------- Indications: Chest pain 786.51.  ------------------------------------------------------------------- History: PMH: No prior cardiac history.  ------------------------------------------------------------------- Study Conclusions  - Left ventricle: The cavity size was normal. There was mild  concentric hypertrophy. Systolic function was normal. The  estimated ejection fraction was in the range of 60% to 65%. Wall  motion was normal; there were no regional wall motion  abnormalities. Doppler parameters are consistent with abnormal  left ventricular relaxation (grade 1 diastolic dysfunction).   Gated Spect Myocardial Perfusion Imaging with Exercise Stress 1 Day Rest/Stress Protocol 05/27/15  The left ventricular ejection fraction is normal (55-65%).  Nuclear stress EF: 64%.  There was no ST segment deviation noted during stress.  The study is normal.  Normal stress nuclear study with no ischemia or infarction; EF 64 with  normal wall motion.    ASSESSMENT & PLAN:   1. Palpitations -Worse episode 5/29 that could be related to stress. Self terminated with coughing. Witness episode by me today, however resolved before able to perform exam. Will get 30 days event monitor. Advice keep log of palpitations. Avoid caffeine. Get TSH.  - Advice that he can try Valsalva maneuver. If worsening symptoms or syncope --> go to ER or call office.   2. 1 st degree AV block - No dizziness or syncope. Continue BB --> he takes chronically.   3. HTN - Stable and well controlled.   4. HLD - Continue statin.    Medication Adjustments/Labs and Tests Ordered: Current medicines are reviewed at length with the patient today.  Concerns regarding medicines are outlined above.  Medication changes, Labs and Tests ordered today are listed in the Patient Instructions below. Patient Instructions  Medication Instructions:  Your physician recommends that you continue on your current medications as directed. Please refer to the Current Medication list given to you  today.   Labwork: Your physician recommends that you have lab work today: tsh   Testing/Procedures: Your physician has recommended that you wear an event monitor. Event monitors are medical devices that record the heart's electrical activity. Doctors most often [place] these monitors to diagnose arrhythmias. Arrhythmias are problems with the speed or rhythm of the heartbeat. The monitor is a small, portable device. You can wear one while you do your normal daily activities. This is usually used to diagnose what is causing palpitations/syncope (passing out).    Follow-Up: Your physician recommends that you keep your scheduled  follow-up appointment with Dr. Rennis Golden at [facility].   Any Other Special Instructions Will Be Listed Below (If Applicable).     If you need a refill on your cardiac medications before your next appointment, please call your pharmacy.        Lorelei Pont, [place]  02/23/2016 4:28 PM    [facility] [address] [place], [place], [place]  [address] Phone: [phone]; Fax: [phone]

## 2016-02-27 ENCOUNTER — Ambulatory Visit (INDEPENDENT_AMBULATORY_CARE_PROVIDER_SITE_OTHER): Payer: 59

## 2016-02-27 DIAGNOSIS — R002 Palpitations: Secondary | ICD-10-CM

## 2016-02-27 DIAGNOSIS — I440 Atrioventricular block, first degree: Secondary | ICD-10-CM | POA: Diagnosis not present

## 2016-02-27 NOTE — Addendum Note (Signed)
Addended by: Reesa ChewJONES, Erian Lariviere G on: 02/27/2016 05:32 PM   Modules accepted: Orders

## 2016-03-09 ENCOUNTER — Telehealth: Payer: Self-pay | Admitting: Internal Medicine

## 2016-03-09 NOTE — Telephone Encounter (Signed)
Spoke w/Russell Hahn @ [employer] Patient triggered on monitor that he passed out Patient's rhythm at time of symptom was sinus rhythm Monitor company attempted to contact patient but was unable to reach  Monitor ordered 02/23/16 for palpitations, 1st degree AVB by Bhagat, PA  patient of Dr. Rennis GoldenHilty  Attempted to contact patient's home # - LM Attempted to contact patient's work # - invalid #  Monitor strips shown to DOD Dr. Royann Shiversroitoru - monitor is normal

## 2016-03-09 NOTE — Telephone Encounter (Signed)
Russell HesselbachMaria is calling with a Critical EKG

## 2016-03-13 NOTE — Telephone Encounter (Signed)
Thanks - Dr H 

## 2016-03-26 ENCOUNTER — Encounter: Payer: Self-pay | Admitting: Internal Medicine

## 2016-04-12 ENCOUNTER — Ambulatory Visit (INDEPENDENT_AMBULATORY_CARE_PROVIDER_SITE_OTHER): Payer: 59 | Admitting: Internal Medicine

## 2016-04-12 ENCOUNTER — Encounter: Payer: Self-pay | Admitting: Internal Medicine

## 2016-04-12 VITALS — BP 108/69 | HR 63 | Ht 73.0 in | Wt 227.6 lb

## 2016-04-12 DIAGNOSIS — I493 Ventricular premature depolarization: Secondary | ICD-10-CM

## 2016-04-12 DIAGNOSIS — R0609 Other forms of dyspnea: Secondary | ICD-10-CM

## 2016-04-12 DIAGNOSIS — I10 Essential (primary) hypertension: Secondary | ICD-10-CM

## 2016-04-12 DIAGNOSIS — R002 Palpitations: Secondary | ICD-10-CM | POA: Diagnosis not present

## 2016-04-12 DIAGNOSIS — R0600 Dyspnea, unspecified: Secondary | ICD-10-CM | POA: Insufficient documentation

## 2016-04-12 MED ORDER — CARVEDILOL 12.5 MG PO TABS: 12.5000 mg | ORAL_TABLET | Freq: Two times a day (BID) | ORAL | Status: DC

## 2016-04-12 NOTE — Progress Notes (Signed)
OFFICE NOTE  Chief Complaint:  Follow-up monitor  Primary Care Physician: Lenora BoysFRIED, ROBERT L, MD  HPI:  Russell Hahn is a pleasant 55 year old male who follows up today for evaluation of chest pain. He reports being in his usual state of health until prior to his admission to the hospital. He was driving and then suddenly became acutely nauseated, diaphoretic, pale and started to have chest discomfort that radiated to his back. This is approximate 2 hours after [facility] at [facility]. Family then called 911 and he was taken to the emergency department. In route he was given nitroglycerin which cause of sharp drop in blood pressure to 60/40. This worsened his symptoms but subsequently his symptoms resolved almost completely in the emergency department. Initial troponin was negative and he was admitted for rule out overnight in which subsequent troponins were negative. An echo was performed which showed normal systolic function and mild diastolic dysfunction with no significant valvular disease or wall motion abnormalities. EKG showed no ischemia but there was a first-degree AV block. I repeated his EKG today which shows sinus bradycardia 59 and a first-degree AV block. He's had no further chest pain symptoms. He does not get short of breath with exertion. 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  04/12/2016  Russell Hahn was seen back today in follow-up. He recently saw Vin Bhagat, PA-C, for palpitations and presyncope. He was placed on a monitor and I reviewed those results. They indicated intermittent PVCs and some sinus tachycardia. No abnormal rhythms were noted. Russell Hahn says that he had a day, particularly on 03/11/2016 where he had a number of palpitations and was under significant stress at work. He also felt like he might pass out but denies doing that. Monitor correlates with this and is straightening frequent PVCs. There is  no episodes of SVT or atrial fibrillation. Although he does report that if he coughs he could get the palpitations to go away. In fact he felt that the palpitations might actually be causing some cough.  PMHx:  Past Medical History  Diagnosis Date  . Peripheral neuropathy (HCC)   . High cholesterol   . Sleep apnea 2005  . Hypertension   . 1St degree AV block   . Palpitations     Past Surgical History  Procedure Laterality Date  . Rotator cuff repair      FAMHx:  Family History  Problem Relation Age of Onset  . Diabetes Mother 5565  . Leukemia Mother 6885  . Alzheimer's disease Father 1278    SOCHx:   reports that he has never smoked. He does not have any smokeless tobacco history on file. He reports that he does not drink alcohol or use illicit drugs.  ALLERGIES:  Allergies  Allergen Reactions  . Codeine Itching  . Hydrocodone Itching    ROS: Pertinent items noted in HPI and remainder of comprehensive ROS otherwise negative.  HOME MEDS: Current Outpatient Prescriptions  Medication Sig Dispense Refill  . aspirin EC 81 MG tablet Take 1 tablet (81 mg total) by mouth daily. 30 tablet 3  . atorvastatin (LIPITOR) 40 MG tablet Take 40 mg by mouth daily.    Marland Kitchen. desvenlafaxine (PRISTIQ) 100 MG 24 hr tablet Take 100 mg by mouth daily.    Marland Kitchen. gabapentin (NEURONTIN) 800 MG tablet Take 0.5 tablets by mouth daily.    Marland Kitchen. LORazepam (ATIVAN) 1 MG tablet Take 0.5 tablets (  0.5 mg total) by mouth every 8 (eight) hours as needed for anxiety or sleep. 15 tablet 0  . carvedilol (COREG) 12.5 MG tablet Take 1 tablet (12.5 mg total) by mouth 2 (two) times daily. 180 tablet 3   No current facility-administered medications for this visit.    LABS/IMAGING: No results found for this or any previous visit (from the past 48 hour(s)). No results found.  WEIGHTS: Wt Readings from Last 3 Encounters:  04/12/16 227 lb 9.6 oz (103.239 kg)  02/23/16 221 lb 12.8 oz (100.608 kg)  05/27/15 213 lb (96.616 kg)      VITALS: BP 108/69 mmHg  Pulse 63  Ht  (1.854 m)  Wt 227 lb 9.6 oz (103.239 kg)  BMI 30.03 kg/m2  EXAM: General appearance: alert and no distress Neck: no carotid bruit and no JVD Lungs: clear to auscultation bilaterally Heart: regular rate and rhythm, S1, S2 normal, no murmur, click, rub or gallop Abdomen: soft, non-tender; bowel sounds normal; no masses,  no organomegaly Extremities: extremities normal, atraumatic, no cyanosis or edema Pulses: 2+ and symmetric Skin: Skin color, texture, turgor normal. No rashes or lesions Neurologic: Grossly normal Psych: Pleasant  EKG: Deferred  ASSESSMENT: 1. Palpitations-PVCs on monitoring 2. History of chest pain, associated nausea and diaphoresis - possible vagal reaction (low risk stress test 2016) 3. Hypertension 4. Dyslipidemia  PLAN: 1.   Russell Hahn is now having some PVCs as well as some fatigue and shortness of breath. He's also recently had weight gain and is not as active as he had been in the past. I've encouraged him to work on exercise and weight loss which would significantly improve his symptoms in my opinion. He may also benefit from a beta blocker with more beta properties rather than Bystolic, which is not a particularly strong beta blocker but does have lusitropic or more "blood pressure lowering" properties. Plan to switch Bystolic to carvedilol 12.5 mg twice a day. He will check home blood pressures and contact us with those numbers.   Follow-up with me in 3 months.  Chrystie Nose, MD, [facility] Attending Cardiologist [facility]  Chrystie Nose 04/12/2016, 5:36 PM

## 2016-04-12 NOTE — Patient Instructions (Addendum)
Medications  STOP nebivolol  START Carvedilol 12.5 mg twice daily.    Dr. Rennis GoldenHilty recommends that you purchase an Omron BP cuff. Arm cuff preferable to wrist cuff.  Also, he would like you to start keeping a record of your daily blood pressures and send them in either by calling or through MyChart to make sure the medication change is effective. Check your blood pressure no more than twice daily, about 2 hours after taking your blood pressure medicine. Make sure you have been sitting/resting for at least 5 minutes and take blood pressure with your arm elevated at heart level.    Follow-up  Your physician recommends that you schedule a follow-up appointment in: 2-3 months with Dr. Rennis GoldenHilty.

## 2016-07-11 ENCOUNTER — Encounter: Payer: Self-pay | Admitting: Internal Medicine

## 2016-07-11 ENCOUNTER — Ambulatory Visit (INDEPENDENT_AMBULATORY_CARE_PROVIDER_SITE_OTHER): Payer: 59 | Admitting: Internal Medicine

## 2016-07-11 VITALS — BP 122/80 | HR 61 | Ht 73.0 in | Wt 231.8 lb

## 2016-07-11 DIAGNOSIS — I493 Ventricular premature depolarization: Secondary | ICD-10-CM | POA: Diagnosis not present

## 2016-07-11 DIAGNOSIS — I10 Essential (primary) hypertension: Secondary | ICD-10-CM | POA: Diagnosis not present

## 2016-07-11 MED ORDER — CARVEDILOL PHOSPHATE ER 20 MG PO CP24: 20.0000 mg | ORAL_CAPSULE | Freq: Every day | ORAL | 6 refills | Status: DC

## 2016-07-11 NOTE — Progress Notes (Signed)
OFFICE NOTE  Chief Complaint:  Follow-up med changes  Primary Care Physician: Russell BoysFRIED, ROBERT L, MD  HPI:  Russell Hahn is a pleasant 55 year old male who follows up today for evaluation of chest pain. He reports being in his usual state of health until prior to his admission to the hospital. He was driving and then suddenly became acutely nauseated, diaphoretic, pale and started to have chest discomfort that radiated to his back. This is approximate 2 hours after eating at [facility] [employer]. Family then called 911 and he was taken to the emergency department. In route he was given nitroglycerin which cause of sharp drop in blood pressure to 60/40. This worsened his symptoms but subsequently his symptoms resolved almost completely in the emergency department. Initial troponin was negative and he was admitted for rule out overnight in which subsequent troponins were negative. An echo was performed which showed normal systolic function and mild diastolic dysfunction with no significant valvular disease or wall motion abnormalities. EKG showed no ischemia but there was a first-degree AV block. I repeated his EKG today which shows sinus bradycardia 59 and a first-degree AV block. He's had no further chest pain symptoms. He does not get short of breath with exertion. 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  04/12/2016  Russell Hahn was seen back today in follow-up. He recently saw Vin Bhagat, PA-C, for palpitations and presyncope. He was placed on a monitor and I reviewed those results. They indicated intermittent PVCs and some sinus tachycardia. No abnormal rhythms were noted. Russell Hahn says that he had a day, particularly on 03/11/2016 where he had a number of palpitations and was under significant stress at work. He also felt like he might pass out but denies doing that. Monitor correlates with this and is straightening frequent PVCs. There  is no episodes of SVT or atrial fibrillation. Although he does report that if he coughs he could get the palpitations to go away. In fact he felt that the palpitations might actually be causing some cough.  07/11/2016  Russell Hahn returns today for follow-up. He reports marked improvement in his PVCs and blood pressure with the change to carvedilol from Bystolic. In general is very pleased with the results of his medication adjustment. His only concern is that he occasionally forgets to take his second dose of the medication. Blood pressure today was 122/80. EKG shows sinus rhythm at 61 without any PVCs.  PMHx:  Past Medical History:  Diagnosis Date  . 1st degree AV block   . High cholesterol   . Hypertension   . Palpitations   . Peripheral neuropathy (HCC)   . Sleep apnea 2005    Past Surgical History:  Procedure Laterality Date  . ROTATOR CUFF REPAIR      FAMHx:  Family History  Problem Relation Age of Onset  . Diabetes Mother 2265  . Leukemia Mother 8085  . Alzheimer's disease Father 278    SOCHx:   reports that he has never smoked. He has never used smokeless tobacco. He reports that he does not drink alcohol or use drugs.  ALLERGIES:  Allergies  Allergen Reactions  . Codeine Itching  . Hydrocodone Itching    ROS: Pertinent items noted in HPI and remainder of comprehensive ROS otherwise negative.  HOME MEDS: Current Outpatient Prescriptions  Medication Sig Dispense Refill  . aspirin EC 81 MG tablet Take 1 tablet (81 mg  total) by mouth daily. 30 tablet 3  . atorvastatin (LIPITOR) 40 MG tablet Take 40 mg by mouth daily.    . carvedilol (COREG) 12.5 MG tablet Take 1 tablet (12.5 mg total) by mouth 2 (two) times daily. 180 tablet 3  . desvenlafaxine (PRISTIQ) 100 MG 24 hr tablet Take 100 mg by mouth daily.    Marland Kitchen gabapentin (NEURONTIN) 800 MG tablet Take 0.5 tablets by mouth daily.    Marland Kitchen LORazepam (ATIVAN) 1 MG tablet Take 0.5 tablets (0.5 mg total) by mouth every 8  (eight) hours as needed for anxiety or sleep. 15 tablet 0  . carvedilol (COREG CR) 20 MG 24 hr capsule Take 1 capsule (20 mg total) by mouth daily. 30 capsule 6   No current facility-administered medications for this visit.     LABS/IMAGING: No results found for this or any previous visit (from the past 48 hour(s)). No results found.  WEIGHTS: Wt Readings from Last 3 Encounters:  07/11/16 231 lb 12.8 oz (105.1 kg)  04/12/16 227 lb 9.6 oz (103.2 kg)  02/23/16 221 lb 12.8 oz (100.6 kg)    VITALS: BP 122/80   Pulse 61   Ht 6\' 1"  (1.854 m)   Wt 231 lb 12.8 oz (105.1 kg)   BMI 30.58 kg/m   EXAM: Deferred  EKG: Deferred  ASSESSMENT: 1. Palpitations-PVCs on monitoring, improved on Coreg 2. History of chest pain, associated nausea and diaphoresis - possible vagal reaction (low risk stress test 2016) 3. Hypertension 4. Dyslipidemia  PLAN: 1.   Russell Hahn has had improvement in his PVCs and blood pressure on carvedilol. He wishes to go to extended release carvedilol given the fact that he occasionally forgets his medications. We'll change him to Coreg CR 20 mg daily. Otherwise he's doing well. Blood pressure is at goal. Follow-up with me annually or sooner as necessary.   Chrystie Nose, MD, [facility] Attending Cardiologist [facility]  Chrystie Nose 07/11/2016, 5:41 PM

## 2016-07-11 NOTE — Patient Instructions (Signed)
Medication Instructions:  START Coreg CR 20mg  Take 1 tab once a day   Labwork: None   Testing/Procedures: None   Follow-Up: Your physician wants you to follow-up in: 12 months with Dr Rennis GoldenHilty. You will receive a reminder letter in the mail two months in advance. If you don't receive a letter, please call our office to schedule the follow-up appointment.  Any Other Special Instructions Will Be Listed Below (If Applicable).     If you need a refill on your cardiac medications before your next appointment, please call your pharmacy.

## 2017-01-08 ENCOUNTER — Other Ambulatory Visit: Payer: Self-pay

## 2017-01-08 MED ORDER — CARVEDILOL PHOSPHATE ER 20 MG PO CP24: 20.0000 mg | ORAL_CAPSULE | Freq: Every day | ORAL | 2 refills | Status: DC

## 2017-01-14 DIAGNOSIS — R05 Cough: Secondary | ICD-10-CM | POA: Diagnosis not present

## 2017-01-14 DIAGNOSIS — L237 Allergic contact dermatitis due to plants, except food: Secondary | ICD-10-CM | POA: Diagnosis not present

## 2017-01-14 DIAGNOSIS — J069 Acute upper respiratory infection, unspecified: Secondary | ICD-10-CM | POA: Diagnosis not present

## 2017-01-23 DIAGNOSIS — Z0001 Encounter for general adult medical examination with abnormal findings: Secondary | ICD-10-CM | POA: Diagnosis not present

## 2017-01-23 DIAGNOSIS — E7800 Pure hypercholesterolemia, unspecified: Secondary | ICD-10-CM | POA: Diagnosis not present

## 2017-01-23 DIAGNOSIS — Z1211 Encounter for screening for malignant neoplasm of colon: Secondary | ICD-10-CM | POA: Diagnosis not present

## 2017-01-23 DIAGNOSIS — I10 Essential (primary) hypertension: Secondary | ICD-10-CM | POA: Diagnosis not present

## 2017-02-22 DIAGNOSIS — L247 Irritant contact dermatitis due to plants, except food: Secondary | ICD-10-CM | POA: Diagnosis not present

## 2017-03-01 DIAGNOSIS — L03116 Cellulitis of left lower limb: Secondary | ICD-10-CM | POA: Diagnosis not present

## 2017-03-01 DIAGNOSIS — W57XXXA Bitten or stung by nonvenomous insect and other nonvenomous arthropods, initial encounter: Secondary | ICD-10-CM | POA: Diagnosis not present

## 2017-03-13 DIAGNOSIS — L255 Unspecified contact dermatitis due to plants, except food: Secondary | ICD-10-CM | POA: Diagnosis not present

## 2017-04-25 DIAGNOSIS — E785 Hyperlipidemia, unspecified: Secondary | ICD-10-CM | POA: Diagnosis not present

## 2017-04-25 DIAGNOSIS — I10 Essential (primary) hypertension: Secondary | ICD-10-CM | POA: Diagnosis not present

## 2017-05-01 ENCOUNTER — Telehealth: Payer: Self-pay | Admitting: *Deleted

## 2017-05-01 NOTE — Telephone Encounter (Signed)
Patient left a msg on the refill vm requesting a call back at [phone] as he has questions about the carvedilol. Thanks, MI

## 2017-05-02 NOTE — Telephone Encounter (Signed)
L/m v/m

## 2017-05-07 ENCOUNTER — Other Ambulatory Visit: Payer: Self-pay | Admitting: Internal Medicine

## 2017-05-07 MED ORDER — CARVEDILOL PHOSPHATE ER 20 MG PO CP24: 20.0000 mg | ORAL_CAPSULE | Freq: Every day | ORAL | 0 refills | Status: DC

## 2017-05-07 NOTE — Telephone Encounter (Signed)
Returned call to patient, informed him I sent in dose for the carvedilol 20mg  CR - he verbalized understanding and thanks. I offered to sched pt for return OV in Oct ( recommended annual appt), patient declined but voiced he would call back soon to schedule.

## 2017-05-07 NOTE — Telephone Encounter (Signed)
New Message    Pt c/o medication issue:  1. Name of Medication:  carvedilol (COREG) 12.5 MG tablet Take 1 tablet (12.5 mg total) by mouth 2 (two) times daily.    he is now to start taking the extended release and the pharmacy wants to know should it be 20 or 40 mg?  2. How are you currently taking this medication (dosage and times per day)? 1  3. Are you having a reaction (difficulty breathing--STAT)? no  4. What is your medication issue?  Needs clarification on dosage 20mg  or 40mg  for extended release ,  Send to [facility]

## 2017-07-08 DIAGNOSIS — L255 Unspecified contact dermatitis due to plants, except food: Secondary | ICD-10-CM | POA: Diagnosis not present

## 2017-07-10 ENCOUNTER — Other Ambulatory Visit: Payer: Self-pay

## 2017-07-10 DIAGNOSIS — J309 Allergic rhinitis, unspecified: Secondary | ICD-10-CM | POA: Diagnosis not present

## 2017-07-10 DIAGNOSIS — I10 Essential (primary) hypertension: Secondary | ICD-10-CM | POA: Diagnosis not present

## 2017-07-10 DIAGNOSIS — E785 Hyperlipidemia, unspecified: Secondary | ICD-10-CM | POA: Diagnosis not present

## 2017-07-10 DIAGNOSIS — K219 Gastro-esophageal reflux disease without esophagitis: Secondary | ICD-10-CM | POA: Diagnosis not present

## 2017-07-10 MED ORDER — CARVEDILOL PHOSPHATE ER 20 MG PO CP24: 20.0000 mg | ORAL_CAPSULE | Freq: Every day | ORAL | 1 refills | Status: DC

## 2017-09-19 ENCOUNTER — Encounter: Payer: Self-pay | Admitting: *Deleted

## 2017-10-08 ENCOUNTER — Encounter: Payer: Self-pay | Admitting: Internal Medicine

## 2017-10-08 ENCOUNTER — Ambulatory Visit (INDEPENDENT_AMBULATORY_CARE_PROVIDER_SITE_OTHER): Payer: 59 | Admitting: Internal Medicine

## 2017-10-08 VITALS — BP 128/70 | HR 76 | Ht 72.0 in | Wt 223.0 lb

## 2017-10-08 DIAGNOSIS — E785 Hyperlipidemia, unspecified: Secondary | ICD-10-CM

## 2017-10-08 DIAGNOSIS — I493 Ventricular premature depolarization: Secondary | ICD-10-CM | POA: Diagnosis not present

## 2017-10-08 DIAGNOSIS — I10 Essential (primary) hypertension: Secondary | ICD-10-CM

## 2017-10-08 NOTE — Patient Instructions (Addendum)
Your physician recommends that you return for lab work in SIX MONTHS - fasting - to check cholesterol  Your physician wants you to follow-up in: ONE YEAR with Dr. Rennis Golden. You will receive a reminder letter in the mail two months in advance. If you don't receive a letter, please call our office to schedule the follow-up appointment.   Fat and Cholesterol Restricted Diet Getting too much fat and cholesterol in your diet may cause health problems. Following this diet helps keep your fat and cholesterol at normal levels. This can keep you from getting sick. What types of fat should I choose?  Choose monosaturated and polyunsaturated fats. These are found in foods such as olive oil, canola oil, flaxseeds, walnuts, almonds, and seeds.  Eat more omega-3 fats. Good choices include salmon, mackerel, sardines, tuna, flaxseed oil, and ground flaxseeds.  Limit saturated fats. These are in animal products such as meats, butter, and cream. They can also be in plant products such as palm oil, palm kernel oil, and coconut oil.  Avoid foods with partially hydrogenated oils in them. These contain trans fats. Examples of foods that have trans fats are stick margarine, some tub margarines, cookies, crackers, and other baked goods. What general guidelines do I need to follow?  Check food labels. Look for the words "trans fat" and "saturated fat."  When preparing a meal: ? Fill half of your plate with vegetables and green salads. ? Fill one fourth of your plate with whole grains. Look for the word "whole" as the first word in the ingredient list. ? Fill one fourth of your plate with lean protein foods.  Eat more foods that have fiber, like apples, carrots, beans, peas, and barley.  Eat more home-cooked foods. Eat less at restaurants and buffets.  Limit or avoid alcohol.  Limit foods high in starch and sugar.  Limit fried foods.  Cook foods without frying them. Baking, boiling, grilling, and broiling are  all great options.  Lose weight if you are overweight. Losing even a small amount of weight can help your overall health. It can also help prevent diseases such as diabetes and heart disease. What foods can I eat? Grains Whole grains, such as whole wheat or whole grain breads, crackers, cereals, and pasta. Unsweetened oatmeal, bulgur, barley, quinoa, or brown rice. Corn or whole wheat flour tortillas. Vegetables Fresh or frozen vegetables (raw, steamed, roasted, or grilled). Green salads. Fruits All fresh, canned (in natural juice), or frozen fruits. Meat and Other Protein Products Ground beef (85% or leaner), grass-fed beef, or beef trimmed of fat. Skinless chicken or [place]. Ground chicken or [place]. Pork trimmed of fat. All fish and seafood. Eggs. Dried beans, peas, or lentils. Unsalted nuts or seeds. Unsalted canned or dry beans. Dairy Low-fat dairy products, such as skim or 1% milk, 2% or reduced-fat cheeses, low-fat ricotta or cottage cheese, or plain low-fat yogurt. Fats and Oils Tub margarines without trans fats. Light or reduced-fat mayonnaise and salad dressings. Avocado. Olive, canola, sesame, or safflower oils. Natural peanut or almond butter (choose ones without added sugar and oil). The items listed above may not be a complete list of recommended foods or beverages. Contact your dietitian for more options. What foods are not recommended? Grains White bread. White pasta. White rice. Cornbread. Bagels, pastries, and croissants. Crackers that contain trans fat. Vegetables White potatoes. Corn. Creamed or fried vegetables. Vegetables in a cheese sauce. Fruits Dried fruits. Canned fruit in light or heavy syrup. Fruit juice. Meat and Other  Protein Products Fatty cuts of meat. Ribs, chicken wings, bacon, sausage, bologna, salami, chitterlings, fatback, hot dogs, bratwurst, and packaged luncheon meats. Liver and organ meats. Dairy Whole or 2% milk, cream, half-and-half, and cream  cheese. Whole milk cheeses. Whole-fat or sweetened yogurt. Full-fat cheeses. Nondairy creamers and whipped toppings. Processed cheese, cheese spreads, or cheese curds. Sweets and Desserts Corn syrup, sugars, honey, and molasses. Candy. Jam and jelly. Syrup. Sweetened cereals. Cookies, pies, cakes, donuts, muffins, and ice cream. Fats and Oils Butter, stick margarine, lard, shortening, ghee, or bacon fat. Coconut, palm kernel, or palm oils. Beverages Alcohol. Sweetened drinks (such as sodas, lemonade, and fruit drinks or punches). The items listed above may not be a complete list of foods and beverages to avoid. Contact your dietitian for more information. This information is not intended to replace advice given to you by your health care provider. Make sure you discuss any questions you have with your health care provider. Document Released: 03/11/2012 Document Revised: 05/17/2016 Document Reviewed: 12/10/2013 Elsevier Interactive Patient Education  [employer]2018 Elsevier Inc.

## 2017-10-09 ENCOUNTER — Encounter: Payer: Self-pay | Admitting: Internal Medicine

## 2017-10-09 NOTE — Progress Notes (Signed)
OFFICE NOTE  Chief Complaint:  No complaints  Primary Care Physician: Marinda ElkFried, Robert, MD  HPI:  Russell Hahn is a pleasant 57 year old male who follows up today for evaluation of chest pain. He reports being in his usual state of health until prior to his admission to the hospital. He was driving and then suddenly became acutely nauseated, diaphoretic, pale and started to have chest discomfort that radiated to his back. This is approximate 2 hours after eating at [facility] [place]. Family then called 911 and he was taken to the emergency department. In route he was given nitroglycerin which cause of sharp drop in blood pressure to 60/40. This worsened his symptoms but subsequently his symptoms resolved almost completely in the emergency department. Initial troponin was negative and he was admitted for rule out overnight in which subsequent troponins were negative. An echo was performed which showed normal systolic function and mild diastolic dysfunction with no significant valvular disease or wall motion abnormalities. EKG showed no ischemia but there was a first-degree AV block. I repeated his EKG today which shows sinus bradycardia 59 and a first-degree AV block. He's had no further chest pain symptoms. He does not get short of breath with exertion. 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  04/12/2016  Russell Hahn was seen back today in follow-up. He recently saw Vin Bhagat, PA-C, for palpitations and presyncope. He was placed on a monitor and I reviewed those results. They indicated intermittent PVCs and some sinus tachycardia. No abnormal rhythms were noted. Russell Hahn says that he had a day, particularly on 03/11/2016 where he had a number of palpitations and was under significant stress at work. He also felt like he might pass out but denies doing that. Monitor correlates with this and is straightening frequent PVCs. There is no  episodes of SVT or atrial fibrillation. Although he does report that if he coughs he could get the palpitations to go away. In fact he felt that the palpitations might actually be causing some cough.  07/11/2016  Russell Hahn returns today for follow-up. He reports marked improvement in his PVCs and blood pressure with the change to carvedilol from Bystolic. In general is very pleased with the results of his medication adjustment. His only concern is that he occasionally forgets to take his second dose of the medication. Blood pressure today was 122/80. EKG shows sinus rhythm at 61 without any PVCs.  10/09/2017  Russell Hahn returns today for follow-up.  He is without complaints.  He saw his primary care provider over the summer and had some lab work.  Of note his total cholesterol was elevated 189 with HDL 31, LDL 122 and triglycerides [id number].  His LDL in 2016 was 76.  He continues to take atorvastatin and the only other changes are with his diet and some weight gain.  He has been compliant on carvedilol and reports no significant PVCs or other symptoms.  He also takes daily aspirin.  Blood pressure was at goal today 128/70.  PMHx:  Past Medical History:  Diagnosis Date  . 1st degree AV block   . High cholesterol   . Hypertension   . Palpitations   . Peripheral neuropathy   . Sleep apnea 2005    Past Surgical History:  Procedure Laterality Date  . ROTATOR CUFF REPAIR      FAMHx:  Family History  Problem Relation Age of Onset  .  Diabetes Mother 52  . Leukemia Mother 25  . Alzheimer's disease Father 15    SOCHx:   reports that  has never smoked. he has never used smokeless tobacco. He reports that he does not drink alcohol or use drugs.  ALLERGIES:  Allergies  Allergen Reactions  . Codeine Itching  . Hydrocodone Itching    ROS: Pertinent items noted in HPI and remainder of comprehensive ROS otherwise negative.  HOME MEDS: Current Outpatient Medications  Medication Sig  Dispense Refill  . aspirin EC 81 MG tablet Take 1 tablet (81 mg total) by mouth daily. 30 tablet 3  . atorvastatin (LIPITOR) 40 MG tablet Take 40 mg by mouth daily.    . carvedilol (COREG CR) 20 MG 24 hr capsule Take 1 capsule (20 mg total) by mouth daily. 90 capsule 1  . desvenlafaxine (PRISTIQ) 100 MG 24 hr tablet Take 100 mg by mouth daily.    Marland Kitchen LORazepam (ATIVAN) 1 MG tablet Take 0.5 tablets (0.5 mg total) by mouth every 8 (eight) hours as needed for anxiety or sleep. 15 tablet 0   No current facility-administered medications for this visit.     LABS/IMAGING: No results found for this or any previous visit (from the past 48 hour(s)). No results found.  WEIGHTS: Wt Readings from Last 3 Encounters:  10/08/17 223 lb (101.2 kg)  07/11/16 231 lb 12.8 oz (105.1 kg)  04/12/16 227 lb 9.6 oz (103.2 kg)    VITALS: BP 128/70   Pulse 76   Ht 6' (1.829 m)   Wt 223 lb (101.2 kg)   BMI 30.24 kg/m   EXAM: General appearance: alert and no distress Neck: no carotid bruit, no JVD and thyroid not enlarged, symmetric, no tenderness/mass/nodules Lungs: clear to auscultation bilaterally Heart: regular rate and rhythm Abdomen: soft, non-tender; bowel sounds normal; no masses,  no organomegaly Extremities: extremities normal, atraumatic, no cyanosis or edema Pulses: 2+ and symmetric Skin: Skin color, texture, turgor normal. No rashes or lesions Neurologic: Grossly normal Psych: Pleasant  EKG: Dermal sinus rhythm at 76-personally reviewed  ASSESSMENT: 1. Palpitations-PVCs on monitoring, improved on Coreg 2. History of chest pain, associated nausea and diaphoresis - possible vagal reaction (low risk stress test 2016) 3. Hypertension 4. Dyslipidemia  PLAN: 1.   Russell Hahn reports good control over his PVCs.  He denies any chest pain or worsening shortness of breath.  His blood pressure is at goal.  His cholesterol is up and may be related to dietary changes and weight gain.  I have  encouraged him to make strict dietary improvements and we discussed decreasing saturated fats particularly.  He should increase his activity level as well.  He says he works 12 hours a day for [employer] and could get more activity if he can make some time for it.  Follow-up annually or sooner as necessary. We'll check a lipid profile in 6 months.   Chrystie Nose, MD, [facility], FACP  [place]  [facility]  Medical Director of the Advanced Lipid Disorders &  Cardiovascular Risk Reduction Clinic Diplomate of the American Board of Clinical Lipidology Attending Cardiologist  Direct Dial: [phone]  Fax: [phone]  Website:  [email or web address] Ronny Korff 10/09/2017, 10:35 AM

## 2018-03-17 DIAGNOSIS — H401131 Primary open-angle glaucoma, bilateral, mild stage: Secondary | ICD-10-CM | POA: Diagnosis not present

## 2018-04-21 DIAGNOSIS — H401131 Primary open-angle glaucoma, bilateral, mild stage: Secondary | ICD-10-CM | POA: Diagnosis not present

## 2018-05-13 DIAGNOSIS — Z1211 Encounter for screening for malignant neoplasm of colon: Secondary | ICD-10-CM | POA: Diagnosis not present

## 2018-05-13 DIAGNOSIS — K635 Polyp of colon: Secondary | ICD-10-CM | POA: Diagnosis not present

## 2018-05-16 ENCOUNTER — Other Ambulatory Visit: Payer: Self-pay | Admitting: Internal Medicine

## 2018-05-19 DIAGNOSIS — H401131 Primary open-angle glaucoma, bilateral, mild stage: Secondary | ICD-10-CM | POA: Diagnosis not present

## 2018-06-12 DIAGNOSIS — E7800 Pure hypercholesterolemia, unspecified: Secondary | ICD-10-CM | POA: Diagnosis not present

## 2018-06-12 DIAGNOSIS — I10 Essential (primary) hypertension: Secondary | ICD-10-CM | POA: Diagnosis not present

## 2018-08-05 DIAGNOSIS — E7800 Pure hypercholesterolemia, unspecified: Secondary | ICD-10-CM | POA: Diagnosis not present

## 2018-08-05 DIAGNOSIS — I10 Essential (primary) hypertension: Secondary | ICD-10-CM | POA: Diagnosis not present

## 2018-08-08 DIAGNOSIS — H401131 Primary open-angle glaucoma, bilateral, mild stage: Secondary | ICD-10-CM | POA: Diagnosis not present

## 2018-09-05 DIAGNOSIS — H401131 Primary open-angle glaucoma, bilateral, mild stage: Secondary | ICD-10-CM | POA: Diagnosis not present

## 2019-04-10 ENCOUNTER — Other Ambulatory Visit: Payer: Self-pay | Admitting: Internal Medicine

## 2019-06-29 ENCOUNTER — Other Ambulatory Visit: Payer: Self-pay | Admitting: Internal Medicine

## 2019-12-01 ENCOUNTER — Other Ambulatory Visit: Payer: Self-pay

## 2019-12-01 ENCOUNTER — Encounter: Payer: Self-pay | Admitting: Internal Medicine

## 2019-12-01 ENCOUNTER — Ambulatory Visit (INDEPENDENT_AMBULATORY_CARE_PROVIDER_SITE_OTHER): Payer: 59 | Admitting: Internal Medicine

## 2019-12-01 VITALS — BP 114/68 | HR 66 | Temp 97.3°F | Ht 73.0 in | Wt 236.4 lb

## 2019-12-01 DIAGNOSIS — E785 Hyperlipidemia, unspecified: Secondary | ICD-10-CM

## 2019-12-01 DIAGNOSIS — I493 Ventricular premature depolarization: Secondary | ICD-10-CM | POA: Diagnosis not present

## 2019-12-01 DIAGNOSIS — I10 Essential (primary) hypertension: Secondary | ICD-10-CM | POA: Diagnosis not present

## 2019-12-01 MED ORDER — CARVEDILOL PHOSPHATE ER 20 MG PO CP24: 20.0000 mg | ORAL_CAPSULE | Freq: Every day | ORAL | 3 refills | Status: AC

## 2019-12-01 MED ORDER — CARVEDILOL PHOSPHATE ER 20 MG PO CP24: 20.0000 mg | ORAL_CAPSULE | Freq: Every day | ORAL | 3 refills | Status: DC

## 2019-12-01 NOTE — Progress Notes (Signed)
OFFICE NOTE  Chief Complaint:  No complaints  Primary Care Physician: Marinda Elk, MD  HPI:  Russell Hahn is a pleasant 59 year old male who follows up today for evaluation of chest pain. He reports being in his usual state of health until prior to his admission to the hospital. He was driving and then suddenly became acutely nauseated, diaphoretic, pale and started to have chest discomfort that radiated to his back. This is approximate 2 hours after eating at [facility] seafood. Family then called 911 and he was taken to the emergency department. In route he was given nitroglycerin which cause of sharp drop in blood pressure to 60/40. This worsened his symptoms but subsequently his symptoms resolved almost completely in the emergency department. Initial troponin was negative and he was admitted for rule out overnight in which subsequent troponins were negative. An echo was performed which showed normal systolic function and mild diastolic dysfunction with no significant valvular disease or wall motion abnormalities. EKG showed no ischemia but there was a first-degree AV block. I repeated his EKG today which shows sinus bradycardia 59 and a first-degree AV block. He's had no further chest pain symptoms. He does not get short of breath with exertion. 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  04/12/2016  Russell Hahn was seen back today in follow-up. He recently saw Vin Bhagat, PA-C, for palpitations and presyncope. He was placed on a monitor and I reviewed those results. They indicated intermittent PVCs and some sinus tachycardia. No abnormal rhythms were noted. Russell Hahn says that he had a day, particularly on 03/11/2016 where he had a number of palpitations and was under significant stress at work. He also felt like he might pass out but denies doing that. Monitor correlates with this and is straightening frequent PVCs. There is no  episodes of SVT or atrial fibrillation. Although he does report that if he coughs he could get the palpitations to go away. In fact he felt that the palpitations might actually be causing some cough.  07/11/2016  Russell Hahn returns today for follow-up. He reports marked improvement in his PVCs and blood pressure with the change to carvedilol from Bystolic. In general is very pleased with the results of his medication adjustment. His only concern is that he occasionally forgets to take his second dose of the medication. Blood pressure today was 122/80. EKG shows sinus rhythm at 61 without any PVCs.  10/09/2017  Russell Hahn returns today for follow-up.  He is without complaints.  He saw his primary care provider over the summer and had some lab work.  Of note his total cholesterol was elevated 189 with HDL 31, LDL 122 and triglycerides 678.  His LDL in 2016 was 76.  He continues to take atorvastatin and the only other changes are with his diet and some weight gain.  He has been compliant on carvedilol and reports no significant PVCs or other symptoms.  He also takes daily aspirin.  Blood pressure was at goal today 128/70.  12/01/2019  Russell Hahn is seen today in follow-up.  Overall he is doing well.  He denies any recurrent PVCs.  Blood pressures been well controlled.  Cholesterol is a little higher.  LDLs in the 140s.  He is on atorvastatin.  Is not had any recent coronary evaluation.  He has a history of stress testing in the past which was negative.  I believe his  target LDL should be less than 100 however he reports he has had longstanding dyslipidemia since his 97s and may be at risk for coronary disease.  PMHx:  Past Medical History:  Diagnosis Date  . 1st degree AV block   . High cholesterol   . Hypertension   . Palpitations   . Peripheral neuropathy   . Sleep apnea 2005    Past Surgical History:  Procedure Laterality Date  . ROTATOR CUFF REPAIR      FAMHx:  Family History    Problem Relation Age of Onset  . Diabetes Mother 87  . Leukemia Mother 83  . Alzheimer's disease Father 80    SOCHx:   reports that he has never smoked. He has never used smokeless tobacco. He reports that he does not drink alcohol or use drugs.  ALLERGIES:  Allergies  Allergen Reactions  . Codeine Itching  . Hydrocodone Itching    ROS: Pertinent items noted in HPI and remainder of comprehensive ROS otherwise negative.  HOME MEDS: Current Outpatient Medications  Medication Sig Dispense Refill  . aspirin EC 81 MG tablet Take 1 tablet (81 mg total) by mouth daily. 30 tablet 3  . atorvastatin (LIPITOR) 40 MG tablet Take 40 mg by mouth daily.    . carvedilol (COREG CR) 20 MG 24 hr capsule Take 1 capsule (20 mg total) by mouth daily. OFFICE VISIT NEEDED 90 capsule 0  . desvenlafaxine (PRISTIQ) 100 MG 24 hr tablet Take 100 mg by mouth daily.    Marland Kitchen LORazepam (ATIVAN) 1 MG tablet Take 0.5 tablets (0.5 mg total) by mouth every 8 (eight) hours as needed for anxiety or sleep. 15 tablet 0   No current facility-administered medications for this visit.    LABS/IMAGING: No results found for this or any previous visit (from the past 48 hour(s)). No results found.  WEIGHTS: Wt Readings from Last 3 Encounters:  12/01/19 236 lb 6.4 oz (107.2 kg)  10/08/17 223 lb (101.2 kg)  07/11/16 231 lb 12.8 oz (105.1 kg)    VITALS: BP 114/68   Pulse 66   Temp (!) 97.3 F (36.3 C)   Ht 6\' 1"  (1.854 m)   Wt 236 lb 6.4 oz (107.2 kg)   SpO2 97%   BMI 31.19 kg/m   EXAM: General appearance: alert and no distress Neck: no carotid bruit, no JVD and thyroid not enlarged, symmetric, no tenderness/mass/nodules Lungs: clear to auscultation bilaterally Heart: regular rate and rhythm Abdomen: soft, non-tender; bowel sounds normal; no masses,  no organomegaly Extremities: extremities normal, atraumatic, no cyanosis or edema Pulses: 2+ and symmetric Skin: Skin color, texture, turgor normal. No rashes  or lesions Neurologic: Grossly normal Psych: Pleasant  EKG: Sinus rhythm first-degree AV block, incomplete right bundle branch block at 62 -personally reviewed  ASSESSMENT: 1. Palpitations-PVCs on monitoring, improved on Coreg 2. History of chest pain, associated nausea and diaphoresis - possible vagal reaction (low risk stress test 2016) 3. Hypertension 4. Dyslipidemia  PLAN: 1.   Russell Hahn has not been struggling with PVCs which are well controlled on Coreg.  He denies any chest pain or shortness of breath.  He has some degree of dyslipidemia even on atorvastatin with LDL over 140.  His target is unclear but should be probably at least less than 100 or maybe even 70.  I like to get a coronary calcium score to further risk stratify him.  I will make medication adjustments based on that but otherwise follow-up with him annually.  Pixie Casino, MD, [facility], [place] Director of the Advanced Lipid Disorders &  Cardiovascular Risk Reduction Clinic Diplomate of the American Board of Clinical Lipidology Attending Cardiologist  Direct Dial: [phone]  Fax: [phone]  Website:  [email or web address] 12/01/2019, 4:10 PM

## 2019-12-01 NOTE — Patient Instructions (Signed)
Medication Instructions:  Your physician recommends that you continue on your current medications as directed. Please refer to the Current Medication list given to you today.  *If you need a refill on your cardiac medications before your next appointment, please call your pharmacy*   Lab Work: NONE If you have labs (blood work) drawn today and your tests are completely normal, you will receive your results only by: Marland Kitchen MyChart Message (if you have MyChart) OR . A paper copy in the mail If you have any lab test that is abnormal or we need to change your treatment, we will call you to review the results.   Testing/Procedures: Dr. Rennis Golden has ordered a CT coronary calcium score. This test is done at [address]. This is $150 out of pocket.   Coronary CalciumScan A coronary calcium scan is an imaging test used to look for deposits of calcium and other fatty materials (plaques) in the inner lining of the blood vessels of the heart (coronary arteries). These deposits of calcium and plaques can partly clog and narrow the coronary arteries without producing any symptoms or warning signs. This puts a person at risk for a heart attack. This test can detect these deposits before symptoms develop. Tell a health care provider about:  Any allergies you have.  All medicines you are taking, including vitamins, herbs, eye drops, creams, and over-the-counter medicines.  Any problems you or family members have had with anesthetic medicines.  Any blood disorders you have.  Any surgeries you have had.  Any medical conditions you have.  Whether you are pregnant or may be pregnant. What are the risks? Generally, this is a safe procedure. However, problems may occur, including:  Harm to a pregnant woman and her unborn baby. This test involves the use of radiation. Radiation exposure can be dangerous to a pregnant woman and her unborn baby. If you are pregnant, you generally should not  have this procedure done.  Slight increase in the risk of cancer. This is because of the radiation involved in the test. What happens before the procedure? No preparation is needed for this procedure. What happens during the procedure?  You will undress and remove any jewelry around your neck or chest.  You will put on a hospital gown.  Sticky electrodes will be placed on your chest. The electrodes will be connected to an electrocardiogram (ECG) machine to record a tracing of the electrical activity of your heart.  A CT scanner will take pictures of your heart. During this time, you will be asked to lie still and hold your breath for 2-3 seconds while a picture of your heart is being taken. The procedure may vary among health care providers and hospitals. What happens after the procedure?  You can get dressed.  You can return to your normal activities.  It is up to you to get the results of your test. Ask your health care provider, or the department that is doing the test, when your results will be ready. Summary  A coronary calcium scan is an imaging test used to look for deposits of calcium and other fatty materials (plaques) in the inner lining of the blood vessels of the heart (coronary arteries).  Generally, this is a safe procedure. Tell your health care provider if you are pregnant or may be pregnant.  No preparation is needed for this procedure.  A CT scanner will take pictures of your heart.  You can return to your normal  activities after the scan is done. This information is not intended to replace advice given to you by your health care provider. Make sure you discuss any questions you have with your health care provider. Document Released: 03/08/2008 Document Revised: 07/30/2016 Document Reviewed: 07/30/2016 Elsevier Interactive Patient Education  2017 [employer].     Follow-Up: At [facility], you and your health needs are our priority.  As part of our  continuing mission to provide you with exceptional heart care, we have created designated Provider Care Teams.  These Care Teams include your primary Cardiologist (physician) and Advanced Practice Providers (APPs -  Physician Assistants and Nurse Practitioners) who all work together to provide you with the care you need, when you need it.  We recommend signing up for the patient portal called "MyChart".  Sign up information is provided on this After Visit Summary.  MyChart is used to connect with patients for Virtual Visits (Telemedicine).  Patients are able to view lab/test results, encounter notes, upcoming appointments, etc.  Non-urgent messages can be sent to your provider as well.   To learn more about what you can do with MyChart, go to [email or web address].    Your next appointment:   12 month(s)  The format for your next appointment:   In Person  Provider:   You may see Chrystie Nose, MD or one of the following Advanced Practice Providers on your designated Care Team:    Azalee Course, PA-C  Micah Flesher, PA-C or   Judy Pimple, [place]    Other Instructions

## 2019-12-15 ENCOUNTER — Other Ambulatory Visit: Payer: Self-pay

## 2019-12-15 ENCOUNTER — Ambulatory Visit (INDEPENDENT_AMBULATORY_CARE_PROVIDER_SITE_OTHER)
Admission: RE | Admit: 2019-12-15 | Discharge: 2019-12-15 | Disposition: A | Discharge status: Home / Self Care | Payer: Self-pay | Source: Ambulatory Visit | Attending: Internal Medicine | Admitting: Internal Medicine

## 2019-12-15 DIAGNOSIS — I10 Essential (primary) hypertension: Secondary | ICD-10-CM

## 2019-12-15 DIAGNOSIS — E785 Hyperlipidemia, unspecified: Secondary | ICD-10-CM

## 2019-12-16 ENCOUNTER — Other Ambulatory Visit: Payer: Self-pay | Admitting: Internal Medicine

## 2019-12-16 DIAGNOSIS — R918 Other nonspecific abnormal finding of lung field: Secondary | ICD-10-CM

## 2019-12-16 DIAGNOSIS — R911 Solitary pulmonary nodule: Secondary | ICD-10-CM

## 2020-11-29 ENCOUNTER — Ambulatory Visit
Admission: RE | Admit: 2020-11-29 | Discharge: 2020-11-29 | Disposition: A | Discharge status: Home / Self Care | Payer: 59 | Source: Ambulatory Visit | Attending: Internal Medicine | Admitting: Internal Medicine

## 2020-11-29 DIAGNOSIS — R911 Solitary pulmonary nodule: Secondary | ICD-10-CM

## 2020-11-29 DIAGNOSIS — R918 Other nonspecific abnormal finding of lung field: Secondary | ICD-10-CM

## 2020-12-20 ENCOUNTER — Telehealth: Payer: Self-pay | Admitting: Internal Medicine

## 2020-12-20 NOTE — Telephone Encounter (Signed)
3.29.22 LVM on cell phone to schedule 1 yr fu w/Dr Hilty. LP

## 2021-01-02 ENCOUNTER — Encounter: Payer: Self-pay | Admitting: Internal Medicine

## 2021-06-09 ENCOUNTER — Emergency Department (HOSPITAL_COMMUNITY)
Admission: EM | Admit: 2021-06-09 | Discharge: 2021-06-10 | Disposition: A | Discharge status: Home / Self Care | Payer: 59 | Attending: Emergency Medicine | Admitting: Emergency Medicine

## 2021-06-09 ENCOUNTER — Other Ambulatory Visit: Payer: Self-pay

## 2021-06-09 DIAGNOSIS — W293XXA Contact with powered garden and outdoor hand tools and machinery, initial encounter: Secondary | ICD-10-CM | POA: Insufficient documentation

## 2021-06-09 DIAGNOSIS — Z79899 Other long term (current) drug therapy: Secondary | ICD-10-CM | POA: Diagnosis not present

## 2021-06-09 DIAGNOSIS — Z7982 Long term (current) use of aspirin: Secondary | ICD-10-CM | POA: Insufficient documentation

## 2021-06-09 DIAGNOSIS — S61311A Laceration without foreign body of left index finger with damage to nail, initial encounter: Secondary | ICD-10-CM | POA: Diagnosis not present

## 2021-06-09 DIAGNOSIS — I10 Essential (primary) hypertension: Secondary | ICD-10-CM | POA: Insufficient documentation

## 2021-06-09 DIAGNOSIS — S6992XA Unspecified injury of left wrist, hand and finger(s), initial encounter: Secondary | ICD-10-CM | POA: Diagnosis present

## 2021-06-09 NOTE — ED Triage Notes (Signed)
Pt arrived via [facility] for laceration to left index finger. Pt reports he was using a hedge trimmer and cut the tip of his finger while doing yard work this evening.

## 2021-06-10 MED ORDER — CEPHALEXIN 500 MG PO CAPS
500.0000 mg | ORAL_CAPSULE | Freq: Once | ORAL | Status: AC
  Administered 2021-06-10: 500 mg via ORAL
  Filled 2021-06-10: qty 1

## 2021-06-10 MED ORDER — CEPHALEXIN 500 MG PO CAPS: 500.0000 mg | ORAL_CAPSULE | Freq: Four times a day (QID) | ORAL | 0 refills | Status: AC

## 2021-06-10 NOTE — ED Provider Notes (Signed)
[facility] EMERGENCY DEPARTMENT Provider Note   CSN: [id number] Arrival date & time: 06/09/21  2047     History Chief Complaint  Patient presents with   Laceration    Left index finger    Russell Hahn is a 60 y.o. male.  Patient is a 60 year old male with history of hypertension presenting with left index finger injury.  Patient was running the hedge tremor when he sliced through his fingertip.  Last tetanus less than 5 years ago.  Bleeding controlled with direct pressure.  The history is provided by the patient.  Laceration Location:  Finger Finger laceration location:  L index finger Depth:  Through underlying tissue Quality: straight   Bleeding: controlled       Past Medical History:  Diagnosis Date   1st degree AV block    High cholesterol    Hypertension    Palpitations    Peripheral neuropathy    Sleep apnea 2005    Patient Active Problem List   Diagnosis Date Noted   PVC's (premature ventricular contractions) 04/12/2016   DOE (dyspnea on exertion) 04/12/2016   1st degree AV block    Palpitations    Dyslipidemia 05/06/2015   Essential hypertension 05/06/2015   Peripheral neuropathy 05/06/2015   H/O Guillain-Barre syndrome 05/06/2015   Chest pain 03/15/2015    Past Surgical History:  Procedure Laterality Date   ROTATOR CUFF REPAIR         Family History  Problem Relation Age of Onset   Diabetes Mother 82   Leukemia Mother 88   Alzheimer's disease Father 4    Social History   Tobacco Use   Smoking status: Never   Smokeless tobacco: Never  Vaping Use   Vaping Use: Never used  Substance Use Topics   Alcohol use: No   Drug use: No    Home Medications Prior to Admission medications   Medication Sig Start Date End Date Taking? Authorizing Provider  aspirin EC 81 MG tablet Take 1 tablet (81 mg total) by mouth daily. 03/15/15   Rai, Delene Ruffini, MD  atorvastatin (LIPITOR) 40 MG tablet Take 40 mg by mouth daily.    [provider]  carvedilol (COREG CR) 20 MG 24 hr capsule Take 1 capsule (20 mg total) by mouth daily. 12/01/19   Hilty, Lisette Abu, MD  desvenlafaxine (PRISTIQ) 100 MG 24 hr tablet Take 100 mg by mouth daily.    [provider]  LORazepam (ATIVAN) 1 MG tablet Take 0.5 tablets (0.5 mg total) by mouth every 8 (eight) hours as needed for anxiety or sleep. 02/21/16   Lindalou Hose, MD    Allergies    Codeine and Hydrocodone  Review of Systems   Review of Systems  All other systems reviewed and are negative.  Physical Exam Updated Vital Signs BP 123/80 (BP Location: Right Arm)   Pulse 64   Temp 98.1 F (36.7 C) (Temporal)   Resp 18   Ht 6' (1.829 m)   Wt 95.3 kg   SpO2 98%   BMI 28.48 kg/m   Physical Exam Vitals and nursing note reviewed.  Constitutional:      General: He is not in acute distress.    Appearance: Normal appearance. He is not ill-appearing.  HENT:     Head: Normocephalic and atraumatic.  Pulmonary:     Effort: Pulmonary effort is normal.  Musculoskeletal:     Comments: The left index finger has a laceration to the fingertip.  This extends through  the nail and the finger pad.  Skin:    General: Skin is warm and dry.  Neurological:     Mental Status: He is alert.    ED Results / Procedures / Treatments   Labs (all labs ordered are listed, but only abnormal results are displayed) Labs Reviewed - No data to display  EKG None  Radiology No results found.  Procedures Procedures   Medications Ordered in ED Medications  cephALEXin (KEFLEX) capsule 500 mg (has no administration in time range)    ED Course  I have reviewed the triage vital signs and the nursing notes.  Pertinent labs & imaging results that were available during my care of the patient were reviewed by me and considered in my medical decision making (see chart for details).    MDM Rules/Calculators/A&P  Patient presents with a finger laceration as described in the HPI.  The laceration  extends through the fingertip and nail.  The area was cleaned with saline and gauze, then closed using Dermabond and Steri-Strips.  Patient to be placed in a finger splint and follow-up as needed.  He will also be treated with Keflex for the next 5 days.  Final Clinical Impression(s) / ED Diagnoses Final diagnoses:  None    Rx / DC Orders ED Discharge Orders     None        Geoffery Lyons, MD 06/10/21 0005

## 2021-06-10 NOTE — Discharge Instructions (Addendum)
Allow the Steri-Strips and Dermabond to fall off on their own.  Keep the wound clean, dry, and covered.  Take Keflex as prescribed.  Return to the emergency department if you develop redness extending up the finger, pus draining from the wound, or other new and concerning symptoms.

## 2021-10-22 IMAGING — CT CT CARDIAC CORONARY ARTERY CALCIUM SCORE
3 series · 14 of 20 positions shown, 15 images · non-contrast
Comparison: None.
COMPARISON: None.
COMPARISON: None.

Addendum:
CLINICAL DATA: 59 year old male with hyperlipidemia, LDL 140 on
atorvastatin.

EXAM:
CT CARDIAC CORONARY ARTERY CALCIUM SCORE
TECHNIQUE: Non-contrast imaging through the heart was performed using
prospective ECG gating. Image post processing was performed on an
independent workstation, allowing for quantitative analysis of the
heart and coronary arteries. Note that this exam targets the heart
and the chest was not imaged in its entirety.

[Series 2: casc 3.0 bv41 2 bestdiast 65 % · axial · 0.41mm/px · z∈[-219,-144]mm · 4 of 43 slices shown, 5 images]
[im 9/43  vessel]
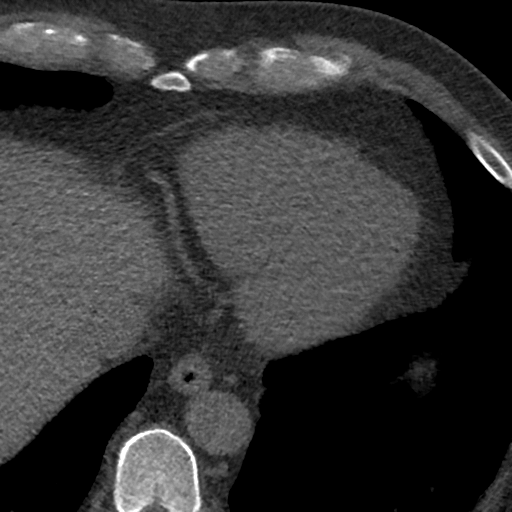
[im 9/43  lung]
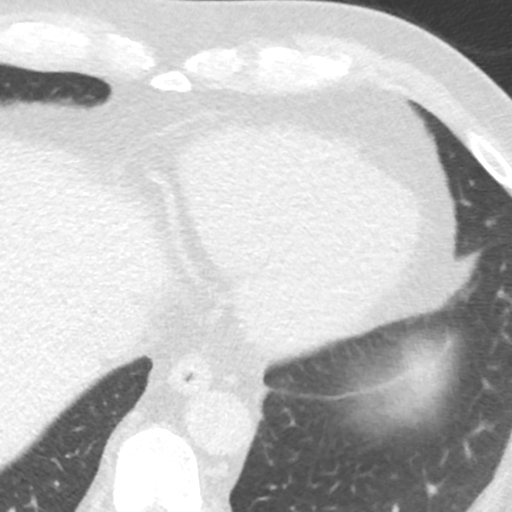
[im 17/43  vessel]
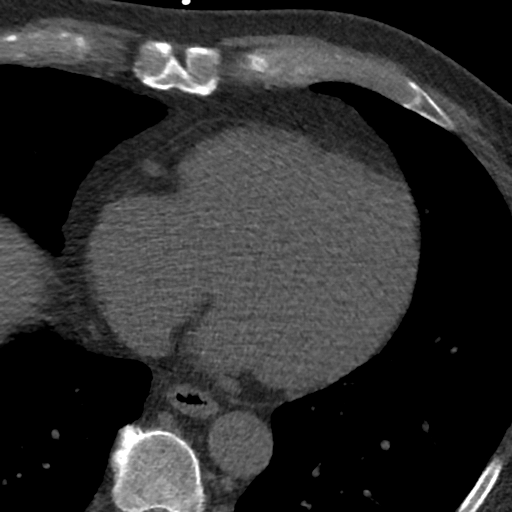
[im 26/43  vessel]
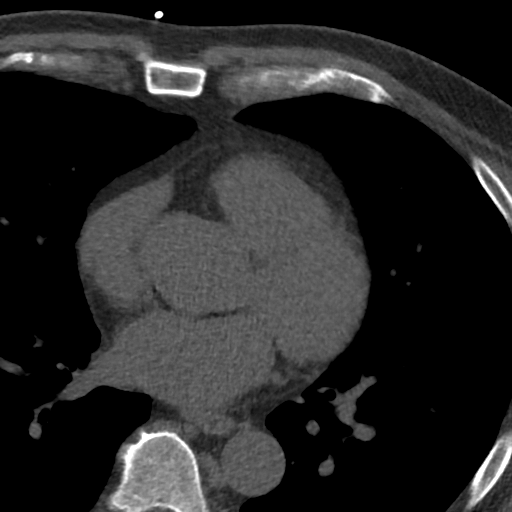
[im 34/43  vessel]
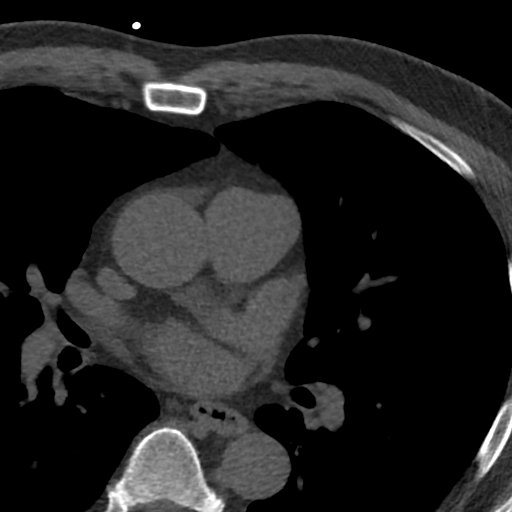

[Series 3: lung 66 % · axial · 0.73mm/px · z∈[-222,-138]mm · 5 of 43 slices shown]
[im 8/43  lung]
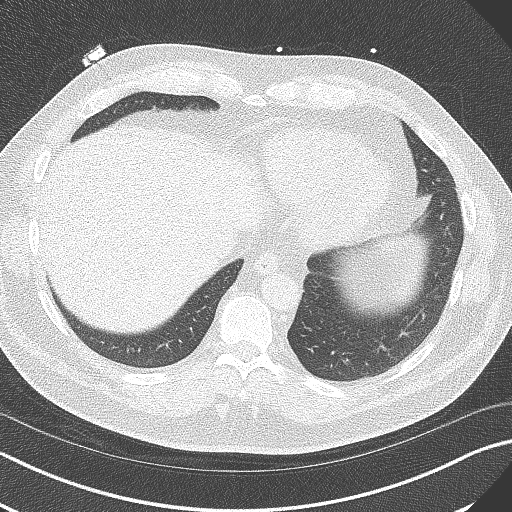
[im 15/43  lung]
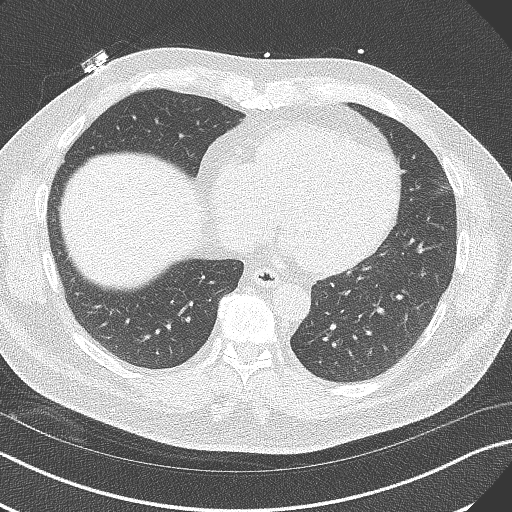
[im 22/43  lung]
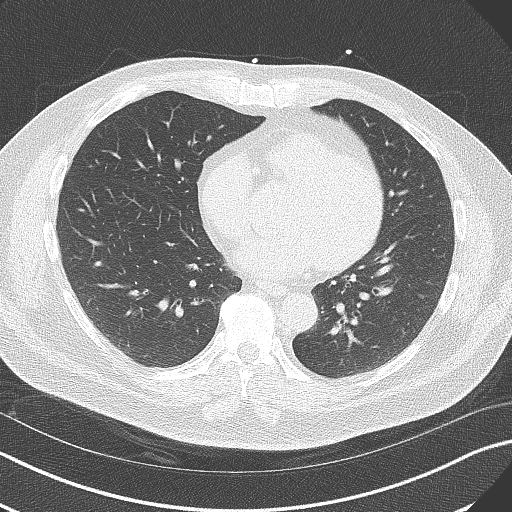
[im 29/43  lung]
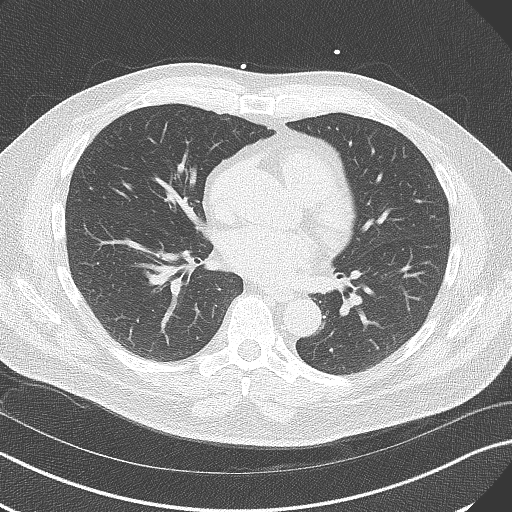
[im 36/43  lung]
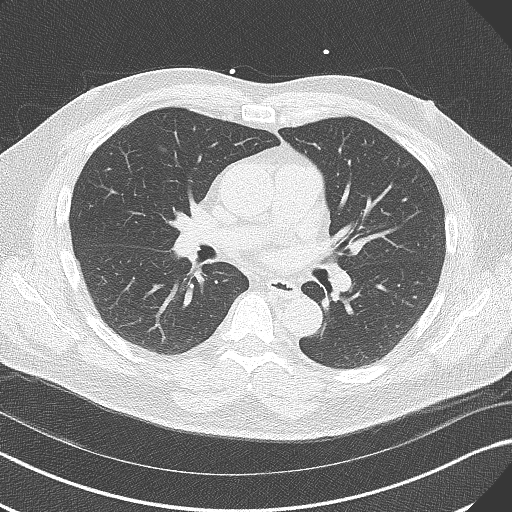

[Series 4: lung st 66 % · axial · 0.73mm/px · z∈[-222,-138]mm · 5 of 43 slices shown]
[im 8/43  lung]
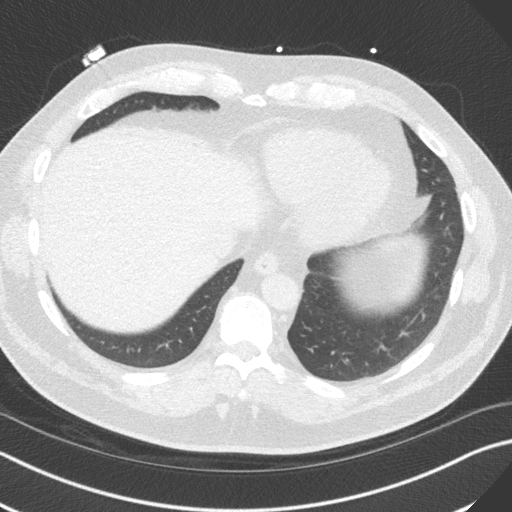
[im 15/43  lung]
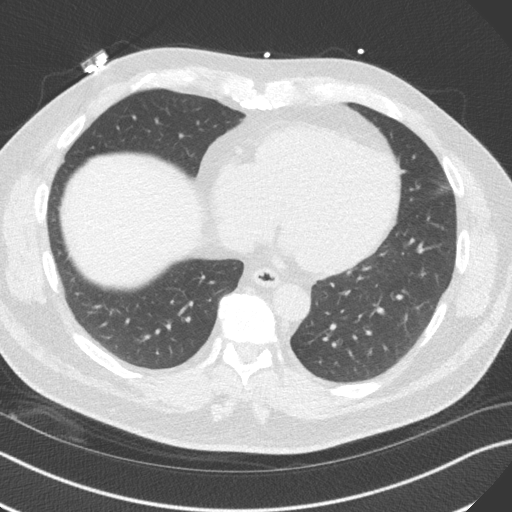
[im 22/43  lung]
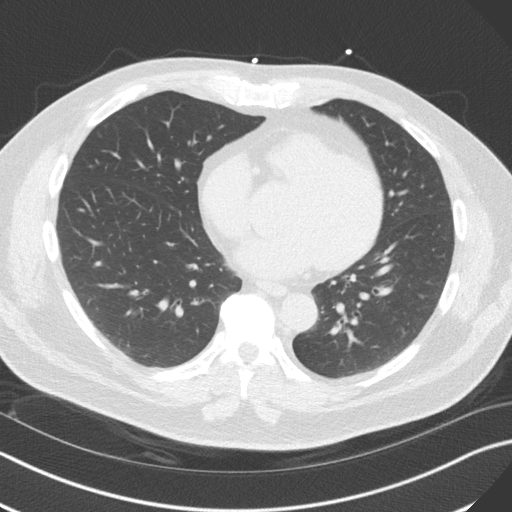
[im 29/43  lung]
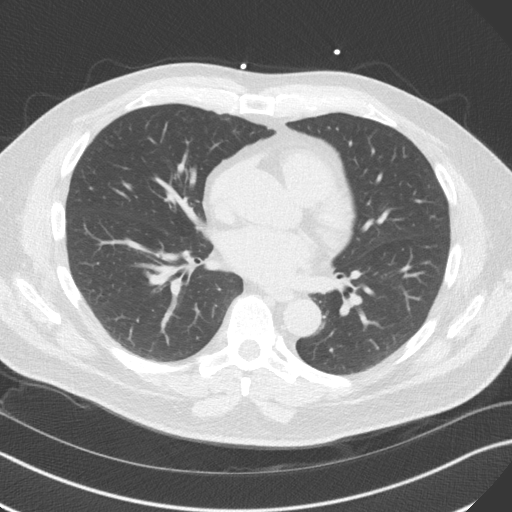
[im 36/43  lung]
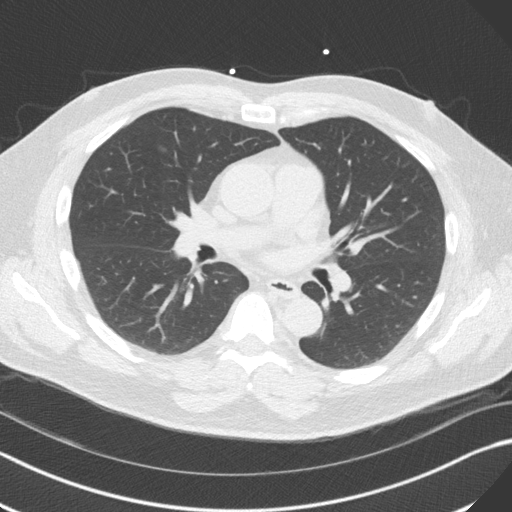

[14 of 20 positions shown; findings below may reference images not displayed]

FINDINGS: CORONARY CALCIUM SCORES:

Left Main: 0

LAD: 0

LCx: 0

RCA: 0

Total Agatston Score: 0

[HOSPITAL] percentile: 0

AORTA MEASUREMENTS:

Ascending Aorta: 37 mm

Descending Aorta: 25 mm

OTHER FINDINGS:

Please see radiology read for non cardiac findings
IMPRESSION: Coronary calcium score of 0

EXAM:
OVER-READ INTERPRETATION  CT CHEST

The following report is an over-read performed by radiologist Dr.
over-read does not include interpretation of cardiac or coronary
anatomy or pathology. The coronary calcium score interpretation by
the cardiologist is attached.
FINDINGS: Cardiovascular: Normal heart size.  No pericardial effusion.

Mediastinum/nodes: No enlarged mediastinal or hilar adenopathy. No
mass identified.

Lungs/pleura: 3 mm lung nodule is identified within the superior
segment of left lower lobe, image [DATE]. The lungs are otherwise
clear.

Upper abdomen: No acute abnormality.

Musculoskeletal: Spondylosis noted within the lower thoracic spine.
IMPRESSION: 1. 3 mm lung nodule is identified within the superior segment of
left lower lobe. Nonspecific. No follow-up needed if patient is
low-risk. Non-contrast chest CT can be considered in 12 months if
patient is high-risk. This recommendation follows the consensus
statement: Guidelines for Management of Incidental Pulmonary Nodules
Detected on CT Images: From the [HOSPITAL] 6891; Radiology
6891; [DATE].

*** End of Addendum ***
FINDINGS: CORONARY CALCIUM SCORES:

Left Main: 0

LAD: 0

LCx: 0

RCA: 0

Total Agatston Score: 0

[HOSPITAL] percentile: 0

AORTA MEASUREMENTS:

Ascending Aorta: 37 mm

Descending Aorta: 25 mm

OTHER FINDINGS:

Please see radiology read for non cardiac findings
IMPRESSION: Coronary calcium score of 0

## 2022-10-07 IMAGING — CT CT CHEST W/O CM
2 of 4 series · 11 of 36 positions shown, 13 images · non-contrast
Comparison: 12/15/2019.

CLINICAL DATA: Abnormal chest radiograph, lung nodule.

EXAM:
CT CHEST WITHOUT CONTRAST
TECHNIQUE: Multidetector CT imaging of the chest was performed following the
standard protocol without IV contrast.

[Series 2: chest 2.00 br40 s3 · axial · 0.54mm/px · z∈[+1506,+1764]mm · 8 of 153 slices shown, 10 images (1 of 2)]
[im 12/153  mediastinal]
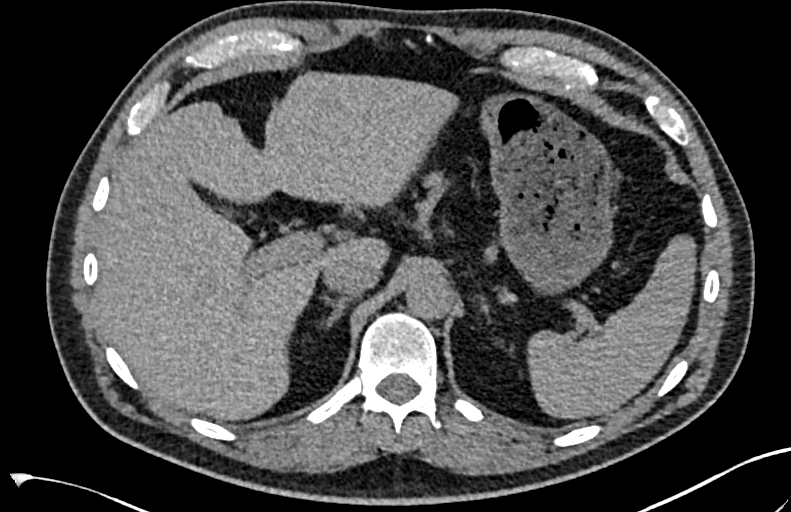
[im 12/153  lung]
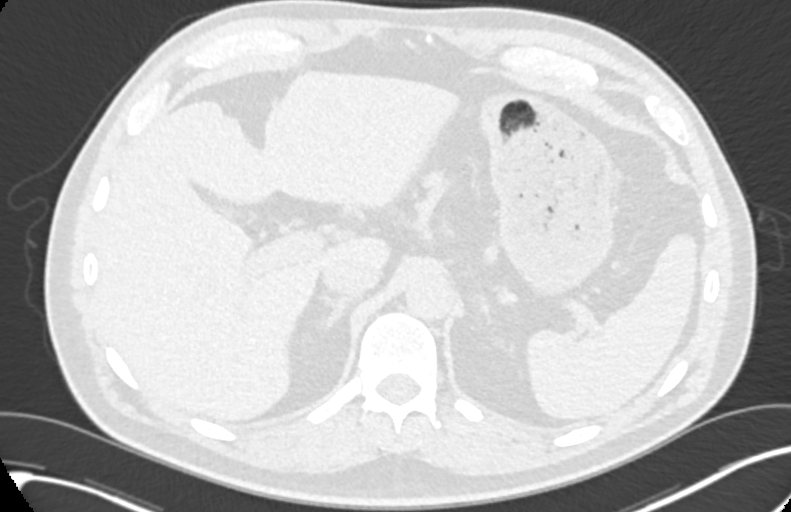
[im 36/153  lung]
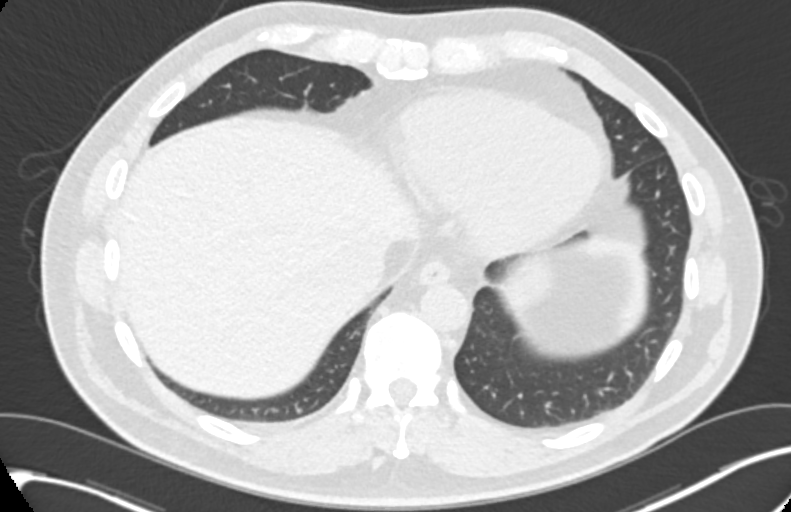
[im 47/153  lung]
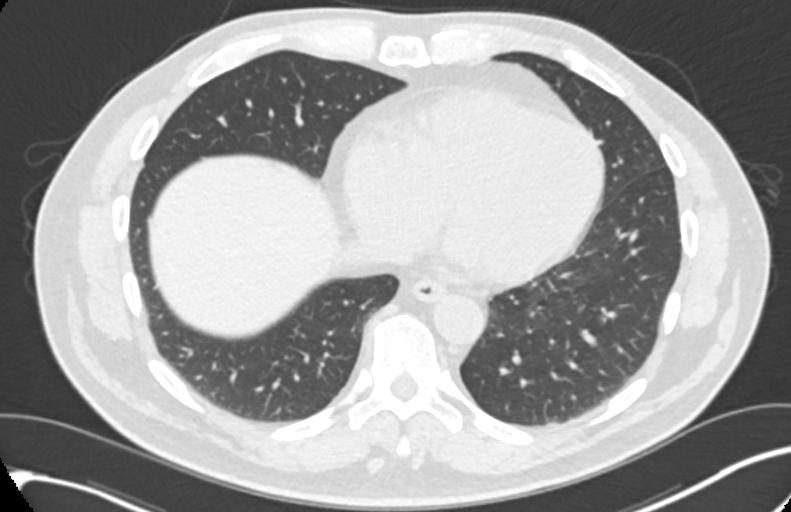
[im 71/153  lung]
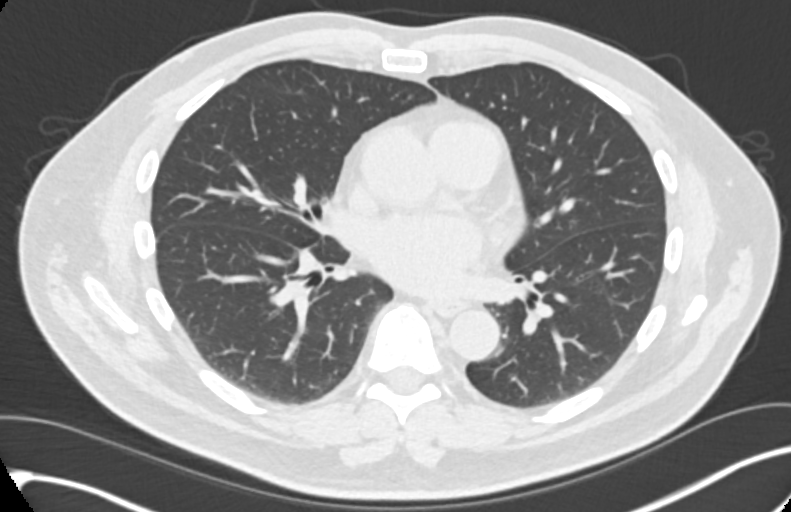
[im 82/153  mediastinal]
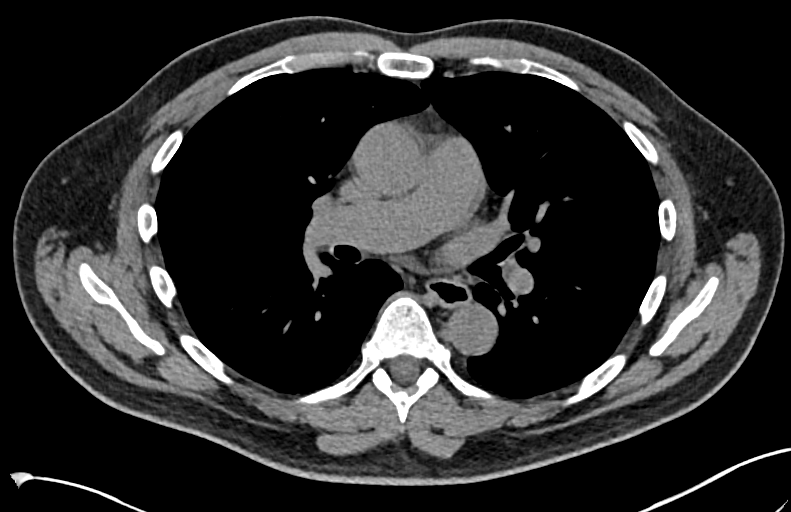
[im 82/153  lung]
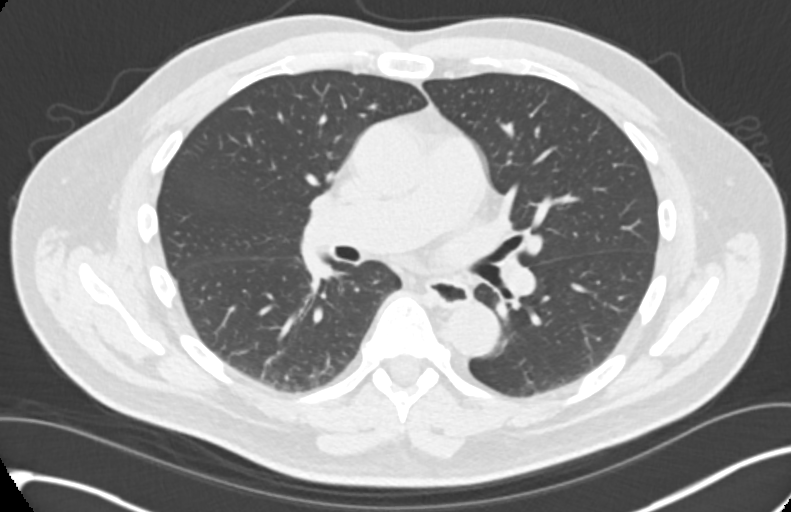
[im 106/153  lung]
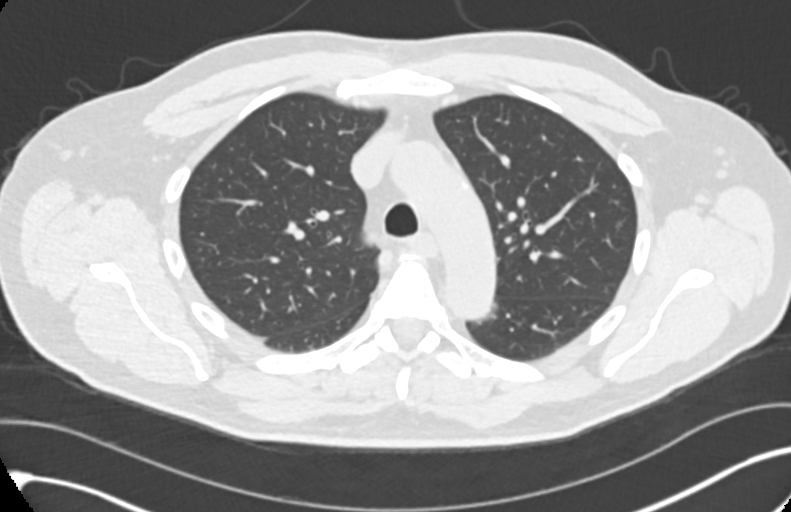
[im 117/153  lung]
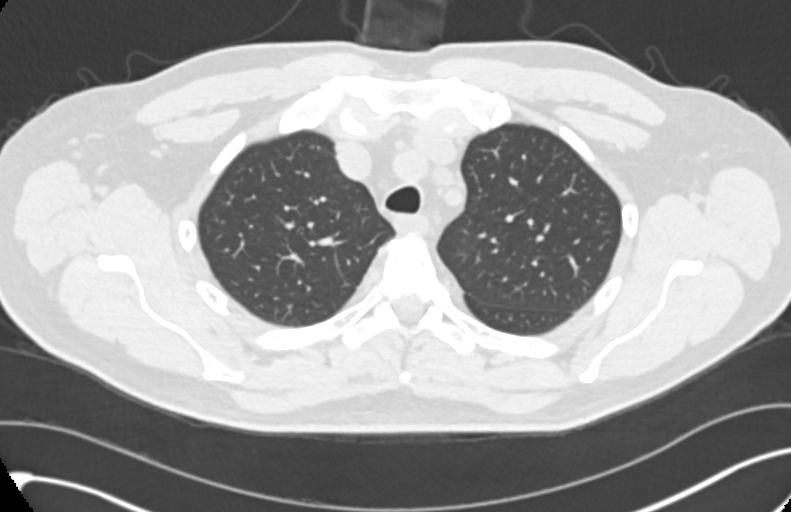
[im 141/153  lung]
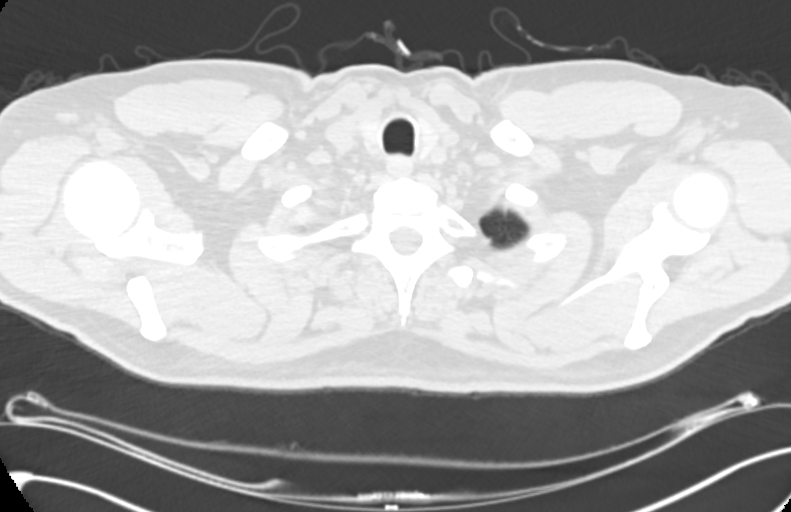

[Series 4: chest 2.00 br40 s3 · coronal · 0.60mm/px · 3 of 137 slices shown (2 of 2)]
[im 28/137  lung]
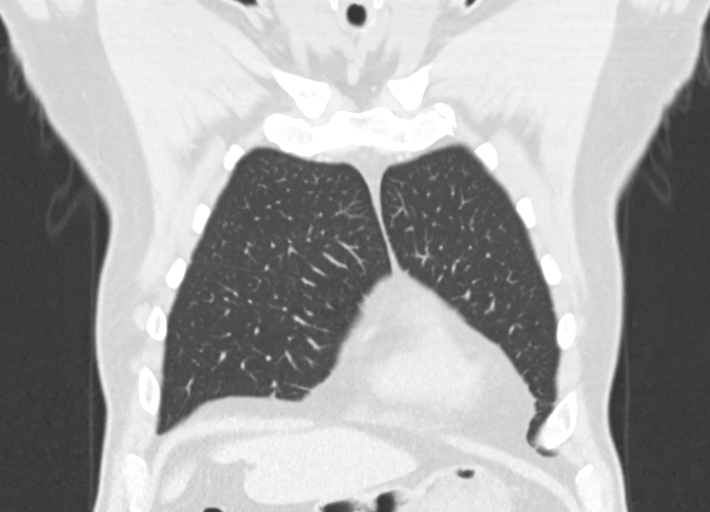
[im 55/137  lung]
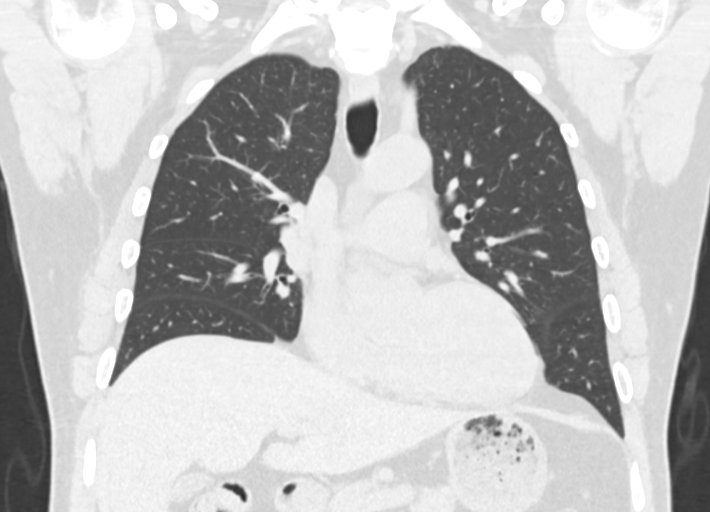
[im 82/137  lung]
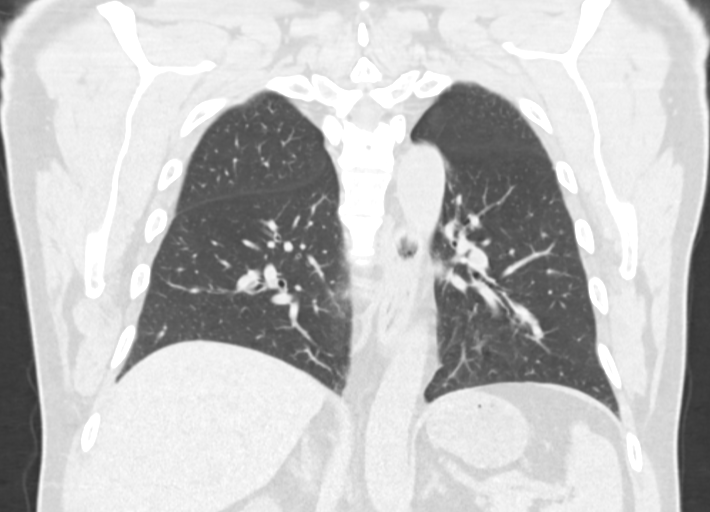

[11 of 36 positions shown; findings below may reference images not displayed]

FINDINGS: Cardiovascular: Atherosclerotic calcification of the aorta. Pulmonic
trunk is enlarged. Heart is at the upper limits of normal in size.
No pericardial effusion.

Mediastinum/Nodes: No pathologically enlarged mediastinal or
axillary lymph nodes. Hilar regions are difficult to definitively
evaluate without IV contrast but appear grossly unremarkable.
Esophagus is grossly unremarkable.

Lungs/Pleura: 3 mm nodule in the superior segment left lower lobe
(8/66), unchanged and benign. No suspicious pulmonary nodules. No
pleural fluid. Airway is unremarkable.

Upper Abdomen: Visualized portions of the liver and gallbladder are
unremarkable. Nodular thickening of the right adrenal gland.
Visualized portions of the left adrenal gland, kidneys, spleen,
pancreas, stomach and bowel are grossly unremarkable.

Musculoskeletal: No worrisome lytic or sclerotic lesions.
IMPRESSION: 1. 3 mm left lower lobe nodule is unchanged and benign. No
suspicious pulmonary nodules.
2.  Aortic atherosclerosis ([id number].J).
3. Enlarged pulmonic trunk, indicative of pulmonary arterial
hypertension.

## 2023-03-29 ENCOUNTER — Other Ambulatory Visit: Payer: Self-pay | Admitting: Family Medicine

## 2023-03-29 DIAGNOSIS — R0602 Shortness of breath: Secondary | ICD-10-CM

## 2023-04-12 ENCOUNTER — Other Ambulatory Visit: Payer: Self-pay | Admitting: Family Medicine

## 2023-04-12 DIAGNOSIS — R7989 Other specified abnormal findings of blood chemistry: Secondary | ICD-10-CM

## 2023-04-24 ENCOUNTER — Ambulatory Visit
Admission: RE | Admit: 2023-04-24 | Discharge: 2023-04-24 | Disposition: A | Discharge status: Home / Self Care | Payer: 59 | Source: Ambulatory Visit | Attending: Family Medicine | Admitting: Family Medicine

## 2023-04-24 DIAGNOSIS — R7989 Other specified abnormal findings of blood chemistry: Secondary | ICD-10-CM

## 2023-04-24 MED ORDER — GADOPICLENOL 0.5 MMOL/ML IV SOLN
9.0000 mL | Freq: Once | INTRAVENOUS | Status: AC | PRN
  Administered 2023-04-24: 9 mL via INTRAVENOUS

## 2024-01-27 ENCOUNTER — Other Ambulatory Visit: Payer: Self-pay | Admitting: Family Medicine

## 2024-01-27 DIAGNOSIS — R5383 Other fatigue: Secondary | ICD-10-CM

## 2024-02-05 ENCOUNTER — Ambulatory Visit
Admission: RE | Admit: 2024-02-05 | Discharge: 2024-02-05 | Disposition: A | Discharge status: Home / Self Care | Source: Ambulatory Visit | Attending: Family Medicine | Admitting: Family Medicine

## 2024-02-05 DIAGNOSIS — R5383 Other fatigue: Secondary | ICD-10-CM
